# Patient Record
Sex: Female | Born: 1980 | Race: White | Hispanic: No | Marital: Single | Smoking: Current every day smoker
Health system: Southern US, Community
[De-identification: ages and names within clinical notes are randomized; demographics above are authoritative.]

## PROBLEM LIST (undated history)

## (undated) DIAGNOSIS — R011 Cardiac murmur, unspecified: Secondary | ICD-10-CM

## (undated) DIAGNOSIS — J40 Bronchitis, not specified as acute or chronic: Secondary | ICD-10-CM

## (undated) HISTORY — PX: TONSILLECTOMY: SUR1361

---

## 2004-08-03 ENCOUNTER — Emergency Department: Payer: Self-pay | Admitting: Emergency Medicine

## 2004-09-13 ENCOUNTER — Emergency Department: Payer: Self-pay | Admitting: General Practice

## 2004-10-24 ENCOUNTER — Emergency Department: Payer: Self-pay | Admitting: Emergency Medicine

## 2004-10-25 ENCOUNTER — Ambulatory Visit: Payer: Self-pay | Admitting: Emergency Medicine

## 2004-11-10 ENCOUNTER — Emergency Department: Payer: Self-pay | Admitting: Emergency Medicine

## 2004-11-23 ENCOUNTER — Emergency Department: Payer: Self-pay | Admitting: General Practice

## 2004-12-05 ENCOUNTER — Emergency Department: Payer: Self-pay | Admitting: Emergency Medicine

## 2006-01-24 ENCOUNTER — Emergency Department: Payer: Self-pay | Admitting: Emergency Medicine

## 2006-04-05 ENCOUNTER — Other Ambulatory Visit: Payer: Self-pay

## 2006-04-05 ENCOUNTER — Emergency Department: Payer: Self-pay | Admitting: Emergency Medicine

## 2006-11-24 ENCOUNTER — Emergency Department: Payer: Self-pay | Admitting: Emergency Medicine

## 2006-11-26 ENCOUNTER — Emergency Department: Payer: Self-pay | Admitting: Unknown Physician Specialty

## 2006-12-12 ENCOUNTER — Emergency Department: Payer: Self-pay | Admitting: Emergency Medicine

## 2007-02-27 ENCOUNTER — Emergency Department: Payer: Self-pay | Admitting: Unknown Physician Specialty

## 2007-04-12 ENCOUNTER — Observation Stay: Payer: Self-pay | Admitting: Unknown Physician Specialty

## 2007-07-02 ENCOUNTER — Observation Stay: Payer: Self-pay | Admitting: Obstetrics and Gynecology

## 2007-07-21 ENCOUNTER — Inpatient Hospital Stay: Payer: Self-pay

## 2007-08-21 ENCOUNTER — Emergency Department: Payer: Self-pay | Admitting: Emergency Medicine

## 2009-02-02 ENCOUNTER — Emergency Department: Payer: Self-pay | Admitting: Emergency Medicine

## 2010-08-14 ENCOUNTER — Emergency Department: Payer: Self-pay | Admitting: Emergency Medicine

## 2011-01-12 ENCOUNTER — Emergency Department: Payer: Self-pay | Admitting: Emergency Medicine

## 2012-10-15 ENCOUNTER — Emergency Department: Payer: Self-pay | Admitting: Emergency Medicine

## 2012-10-15 LAB — COMPREHENSIVE METABOLIC PANEL
Albumin: 3.6 g/dL (ref 3.4–5.0)
Alkaline Phosphatase: 98 U/L (ref 50–136)
Anion Gap: 8 (ref 7–16)
BUN: 6 mg/dL — ABNORMAL LOW (ref 7–18)
Calcium, Total: 9.1 mg/dL (ref 8.5–10.1)
Chloride: 108 mmol/L — ABNORMAL HIGH (ref 98–107)
Creatinine: 0.61 mg/dL (ref 0.60–1.30)
EGFR (African American): >60
EGFR (Non-African Amer.): >60
Osmolality: 278 (ref 275–301)
Potassium: 3.8 mmol/L (ref 3.5–5.1)
SGOT(AST): 16 U/L (ref 15–37)
SGPT (ALT): 27 U/L (ref 12–78)
Sodium: 140 mmol/L (ref 136–145)
Total Protein: 7.2 g/dL (ref 6.4–8.2)

## 2012-10-15 LAB — URINALYSIS, COMPLETE
Bilirubin,UR: NEGATIVE
Ketone: NEGATIVE
Specific Gravity: 1.024 (ref 1.003–1.030)

## 2012-10-15 LAB — CBC
HGB: 13.9 g/dL (ref 12.0–16.0)
MCHC: 32.5 g/dL (ref 32.0–36.0)
MCV: 93 fL (ref 80–100)
Platelet: 332 10*3/uL (ref 150–440)
RBC: 4.6 10*6/uL (ref 3.80–5.20)
WBC: 15.3 10*3/uL — ABNORMAL HIGH (ref 3.6–11.0)

## 2012-10-15 LAB — PREGNANCY, URINE: Pregnancy Test, Urine: POSITIVE m[IU]/mL

## 2012-10-25 ENCOUNTER — Emergency Department: Payer: Self-pay | Admitting: Emergency Medicine

## 2012-10-25 LAB — URINALYSIS, COMPLETE
Glucose,UR: NEGATIVE mg/dL (ref 0–75)
Ketone: NEGATIVE
Specific Gravity: 1.02 (ref 1.003–1.030)
Squamous Epithelial: 8

## 2012-10-25 LAB — CBC
HCT: 41 % (ref 35.0–47.0)
HGB: 14.4 g/dL (ref 12.0–16.0)
Platelet: 289 10*3/uL (ref 150–440)
RBC: 4.43 10*6/uL (ref 3.80–5.20)
WBC: 12.5 10*3/uL — ABNORMAL HIGH (ref 3.6–11.0)

## 2012-10-25 LAB — HCG, QUANTITATIVE, PREGNANCY: Beta Hcg, Quant.: 1226 m[IU]/mL — ABNORMAL HIGH

## 2013-01-15 ENCOUNTER — Ambulatory Visit: Payer: Self-pay | Admitting: Family Medicine

## 2013-01-15 LAB — RAPID STREP-A WITH REFLX: Micro Text Report: NEGATIVE

## 2013-01-18 LAB — BETA STREP CULTURE(ARMC)

## 2013-07-13 ENCOUNTER — Emergency Department: Payer: Self-pay | Admitting: Emergency Medicine

## 2013-08-08 ENCOUNTER — Emergency Department: Payer: Self-pay | Admitting: Emergency Medicine

## 2013-08-08 LAB — URINALYSIS, COMPLETE
BILIRUBIN, UR: NEGATIVE
Blood: NEGATIVE
GLUCOSE, UR: NEGATIVE mg/dL (ref 0–75)
KETONE: NEGATIVE
NITRITE: NEGATIVE
Ph: 6 (ref 4.5–8.0)
Protein: NEGATIVE
Specific Gravity: 1.014 (ref 1.003–1.030)
Squamous Epithelial: 14
WBC UR: 20 /HPF (ref 0–5)

## 2013-08-08 LAB — CBC
HCT: 38.8 % (ref 35.0–47.0)
HGB: 13.3 g/dL (ref 12.0–16.0)
MCH: 30.7 pg (ref 26.0–34.0)
MCHC: 34.3 g/dL (ref 32.0–36.0)
MCV: 90 fL (ref 80–100)
Platelet: 261 10*3/uL (ref 150–440)
RBC: 4.33 10*6/uL (ref 3.80–5.20)
RDW: 13.6 % (ref 11.5–14.5)
WBC: 14.6 10*3/uL — AB (ref 3.6–11.0)

## 2013-08-08 LAB — WET PREP, GENITAL

## 2013-08-08 LAB — HCG, QUANTITATIVE, PREGNANCY: Beta Hcg, Quant.: 89431 m[IU]/mL — ABNORMAL HIGH

## 2013-08-08 LAB — GC/CHLAMYDIA PROBE AMP

## 2013-08-15 ENCOUNTER — Ambulatory Visit: Payer: Self-pay | Admitting: Family Medicine

## 2013-08-15 DIAGNOSIS — I059 Rheumatic mitral valve disease, unspecified: Secondary | ICD-10-CM

## 2013-09-08 ENCOUNTER — Encounter: Payer: Self-pay | Admitting: Maternal and Fetal Medicine

## 2013-10-14 ENCOUNTER — Emergency Department: Payer: Self-pay | Admitting: Emergency Medicine

## 2013-10-14 LAB — URINALYSIS, COMPLETE
Bacteria: NONE SEEN
Bilirubin,UR: NEGATIVE
Glucose,UR: NEGATIVE mg/dL (ref 0–75)
KETONE: NEGATIVE
Leukocyte Esterase: NEGATIVE
NITRITE: NEGATIVE
PROTEIN: NEGATIVE
Ph: 7 (ref 4.5–8.0)
Specific Gravity: 1.003 (ref 1.003–1.030)
Squamous Epithelial: 3
WBC UR: 1 /HPF (ref 0–5)

## 2013-10-14 LAB — CBC WITH DIFFERENTIAL/PLATELET
BASOS ABS: 0.1 10*3/uL (ref 0.0–0.1)
BASOS PCT: 0.4 %
EOS PCT: 0.8 %
Eosinophil #: 0.1 10*3/uL (ref 0.0–0.7)
HCT: 34.8 % — ABNORMAL LOW (ref 35.0–47.0)
HGB: 12 g/dL (ref 12.0–16.0)
Lymphocyte #: 2.6 10*3/uL (ref 1.0–3.6)
Lymphocyte %: 15.9 %
MCH: 31.9 pg (ref 26.0–34.0)
MCHC: 34.3 g/dL (ref 32.0–36.0)
MCV: 93 fL (ref 80–100)
MONOS PCT: 5 %
Monocyte #: 0.8 x10 3/mm (ref 0.2–0.9)
NEUTROS ABS: 12.6 10*3/uL — AB (ref 1.4–6.5)
NEUTROS PCT: 77.9 %
PLATELETS: 264 10*3/uL (ref 150–440)
RBC: 3.75 10*6/uL — AB (ref 3.80–5.20)
RDW: 13.9 % (ref 11.5–14.5)
WBC: 16.3 10*3/uL — ABNORMAL HIGH (ref 3.6–11.0)

## 2013-10-14 LAB — COMPREHENSIVE METABOLIC PANEL
ALBUMIN: 2.8 g/dL — AB (ref 3.4–5.0)
ALT: 16 U/L (ref 12–78)
Alkaline Phosphatase: 127 U/L — ABNORMAL HIGH
Anion Gap: 5 — ABNORMAL LOW (ref 7–16)
BILIRUBIN TOTAL: 0.2 mg/dL (ref 0.2–1.0)
BUN: 5 mg/dL — AB (ref 7–18)
CO2: 27 mmol/L (ref 21–32)
CREATININE: 0.5 mg/dL — AB (ref 0.60–1.30)
Calcium, Total: 8.9 mg/dL (ref 8.5–10.1)
Chloride: 105 mmol/L (ref 98–107)
EGFR (Non-African Amer.): >60
Glucose: 94 mg/dL (ref 65–99)
Osmolality: 271 (ref 275–301)
POTASSIUM: 4.1 mmol/L (ref 3.5–5.1)
SGOT(AST): 12 U/L — ABNORMAL LOW (ref 15–37)
SODIUM: 137 mmol/L (ref 136–145)
TOTAL PROTEIN: 6.7 g/dL (ref 6.4–8.2)

## 2013-10-16 ENCOUNTER — Emergency Department: Payer: Self-pay | Admitting: Sports Medicine

## 2014-01-06 ENCOUNTER — Observation Stay: Payer: Self-pay | Admitting: Obstetrics and Gynecology

## 2014-01-06 LAB — URINALYSIS, COMPLETE
Bacteria: NONE SEEN
Bilirubin,UR: NEGATIVE
Blood: NEGATIVE
Glucose,UR: NEGATIVE mg/dL (ref 0–75)
Ketone: NEGATIVE
NITRITE: NEGATIVE
Ph: 6 (ref 4.5–8.0)
Protein: NEGATIVE
Specific Gravity: 1.014 (ref 1.003–1.030)
Squamous Epithelial: 39
WBC UR: 4 /HPF (ref 0–5)

## 2014-01-07 LAB — URINE CULTURE

## 2014-01-18 ENCOUNTER — Ambulatory Visit: Payer: Self-pay | Admitting: Family Medicine

## 2014-02-12 ENCOUNTER — Observation Stay: Payer: Self-pay | Admitting: Obstetrics and Gynecology

## 2014-02-12 ENCOUNTER — Observation Stay: Payer: Self-pay

## 2014-02-13 LAB — URINALYSIS, COMPLETE
BILIRUBIN, UR: NEGATIVE
Bacteria: NONE SEEN
Blood: NEGATIVE
Glucose,UR: NEGATIVE mg/dL (ref 0–75)
KETONE: NEGATIVE
Leukocyte Esterase: NEGATIVE
NITRITE: NEGATIVE
Ph: 6 (ref 4.5–8.0)
Protein: NEGATIVE
RBC,UR: 1 /HPF (ref 0–5)
Specific Gravity: 1.008 (ref 1.003–1.030)
Squamous Epithelial: 2
WBC UR: NONE SEEN /HPF (ref 0–5)

## 2014-02-27 ENCOUNTER — Observation Stay: Payer: Self-pay | Admitting: Obstetrics and Gynecology

## 2014-03-04 ENCOUNTER — Inpatient Hospital Stay: Payer: Self-pay | Admitting: Obstetrics and Gynecology

## 2014-03-04 LAB — CBC WITH DIFFERENTIAL/PLATELET
Basophil #: 0.1 10*3/uL (ref 0.0–0.1)
Basophil %: 0.4 %
EOS ABS: 0.2 10*3/uL (ref 0.0–0.7)
Eosinophil %: 0.7 %
HCT: 36.2 % (ref 35.0–47.0)
HGB: 12.2 g/dL (ref 12.0–16.0)
LYMPHS PCT: 15.8 %
Lymphocyte #: 3.5 10*3/uL (ref 1.0–3.6)
MCH: 30.7 pg (ref 26.0–34.0)
MCHC: 33.7 g/dL (ref 32.0–36.0)
MCV: 91 fL (ref 80–100)
Monocyte #: 1.2 x10 3/mm — ABNORMAL HIGH (ref 0.2–0.9)
Monocyte %: 5.3 %
NEUTROS PCT: 77.8 %
Neutrophil #: 17 10*3/uL — ABNORMAL HIGH (ref 1.4–6.5)
PLATELETS: 334 10*3/uL (ref 150–440)
RBC: 3.96 10*6/uL (ref 3.80–5.20)
RDW: 14.4 % (ref 11.5–14.5)
WBC: 21.9 10*3/uL — AB (ref 3.6–11.0)

## 2014-03-04 LAB — DRUG SCREEN, URINE

## 2014-03-05 LAB — HEMATOCRIT: HCT: 31.3 % — ABNORMAL LOW (ref 35.0–47.0)

## 2014-05-21 IMAGING — US US OB < 14 WEEKS - US OB TV
1 series · 14 of 28 positions shown · non-contrast
Comparison: none

REASON FOR EXAM: vaginal bleeding recent positive pregnancy test eval
COMMENTS:

PROCEDURE:     US  - US OB LESS THAN 14 WEEKS/W TRANS  - October 16, 2012  [DATE]
RESULT:     Comparison: 01/13/2011
TECHNIQUE: Multiple grayscale and color Doppler images were obtained of the
pelvis via transabdominal and endovaginal ultrasound.

[Series 1: us ob < 14 weeks - us ob tv · 0.28mm/px · 14 of 45 slices shown]
[im 2/45]
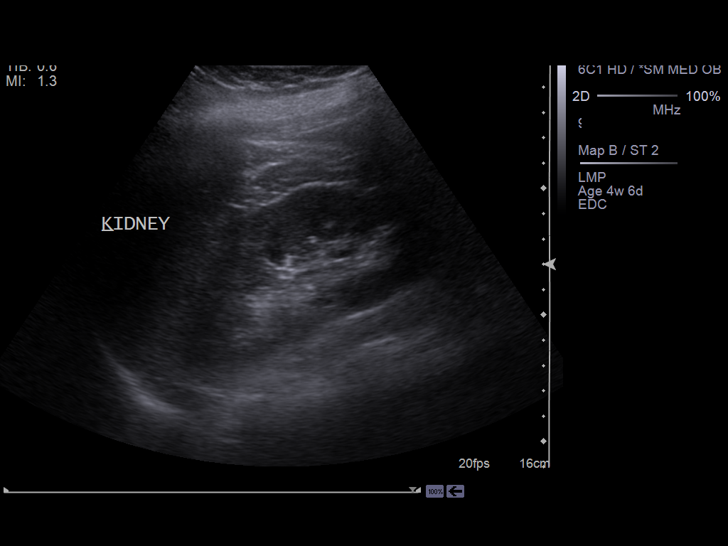
[im 5/45]
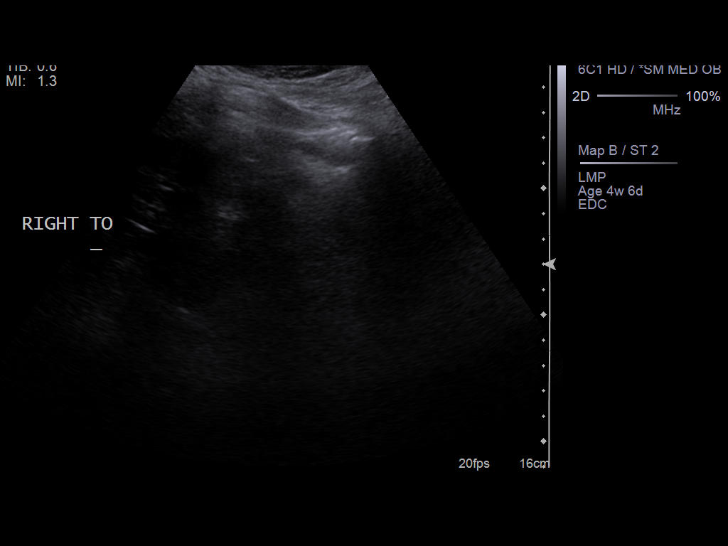
[im 9/45]
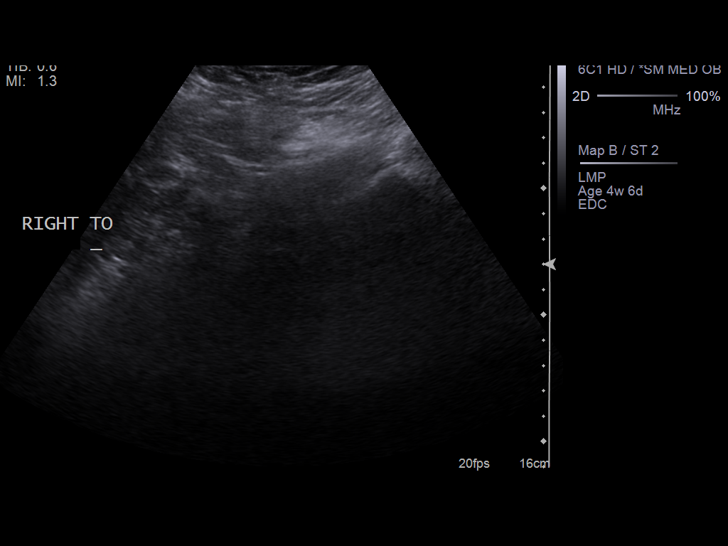
[im 12/45]
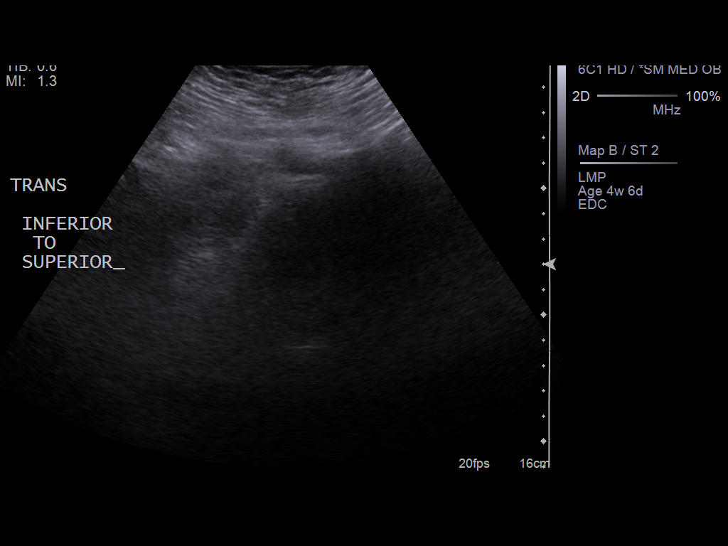
[im 15/45]
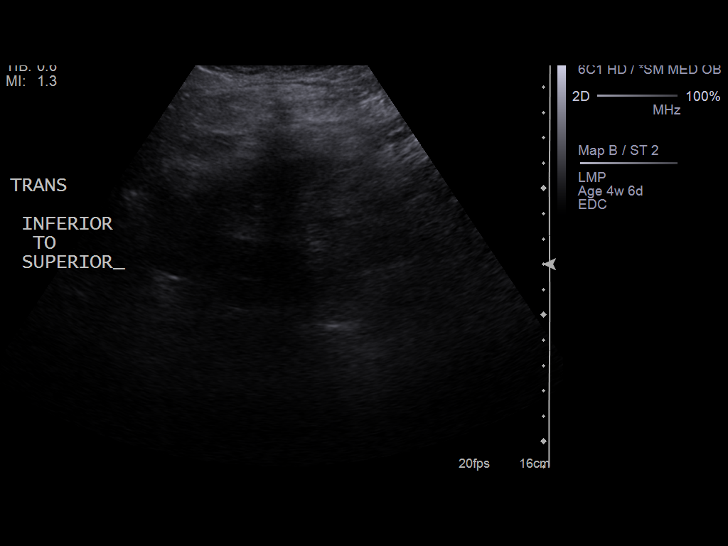
[im 18/45]
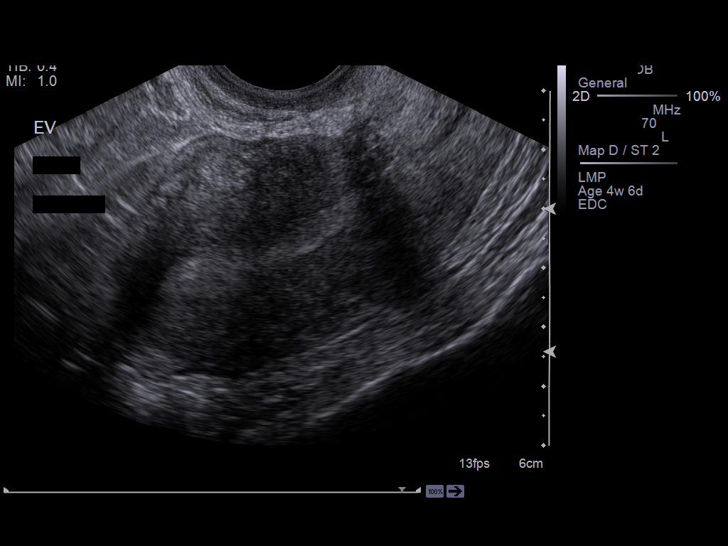
[im 22/45]
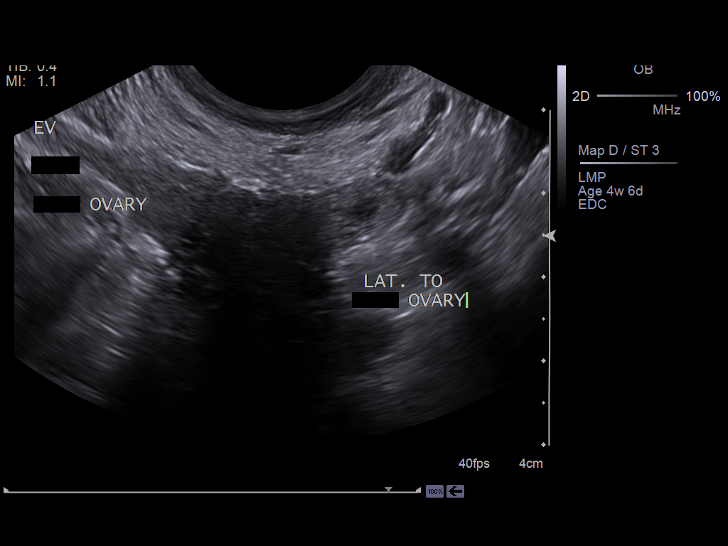
[im 25/45]
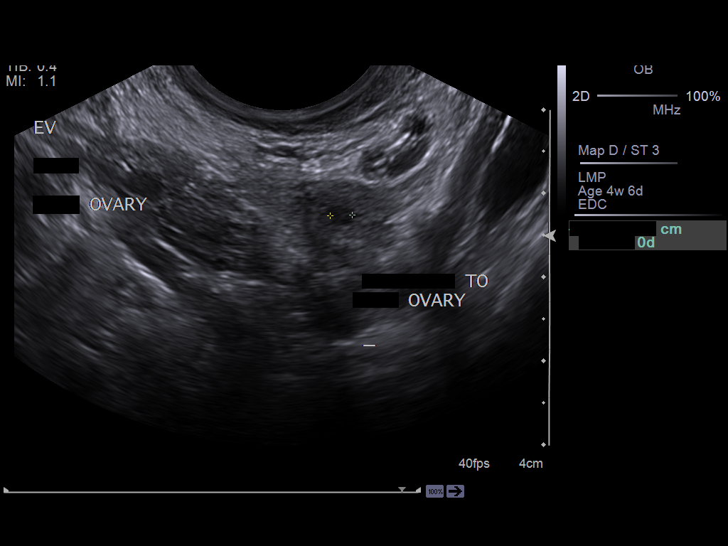
[im 28/45]
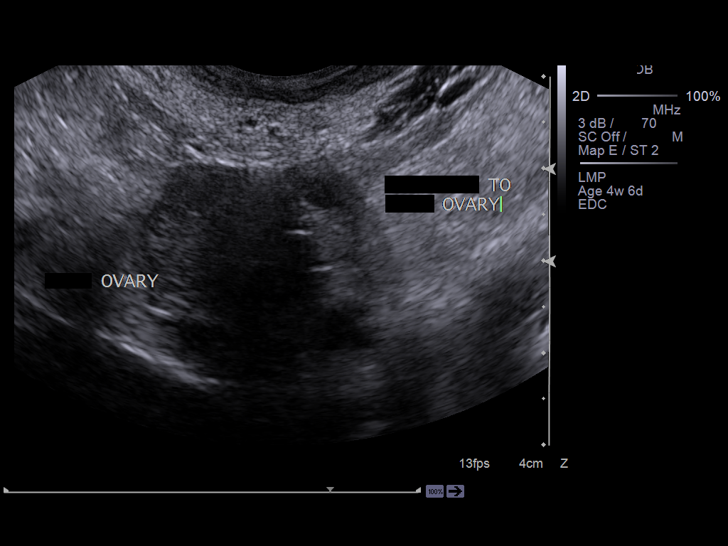
[im 31/45]
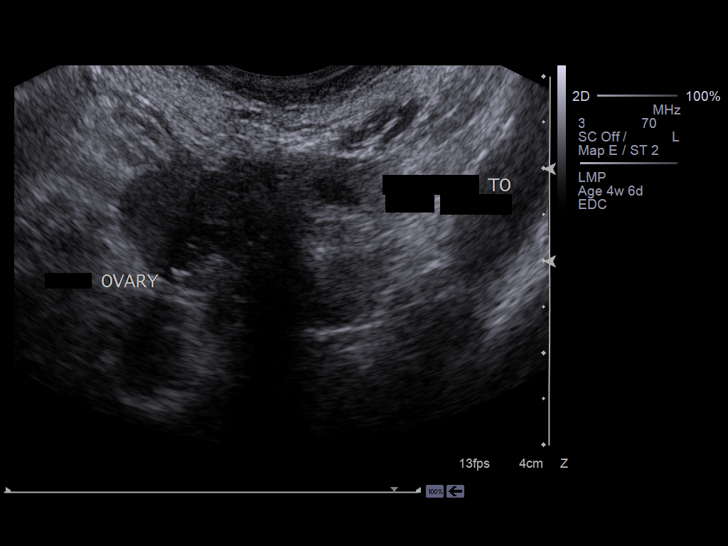
[im 35/45]
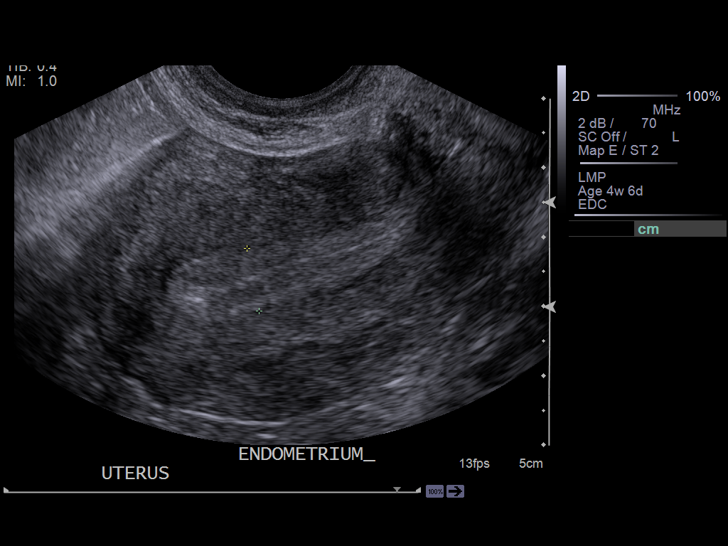
[im 38/45]
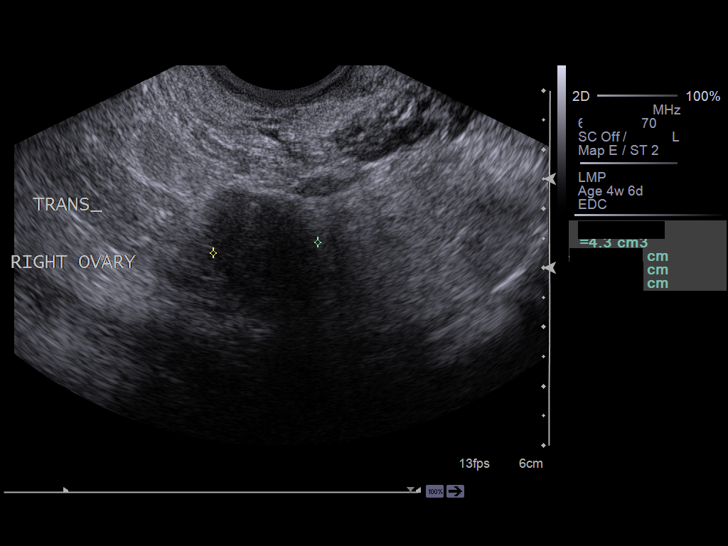
[im 41/45]
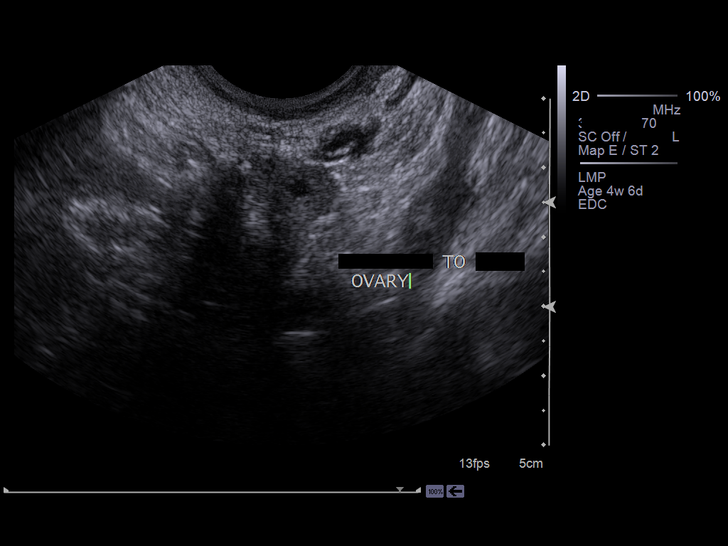
[im 45/45]
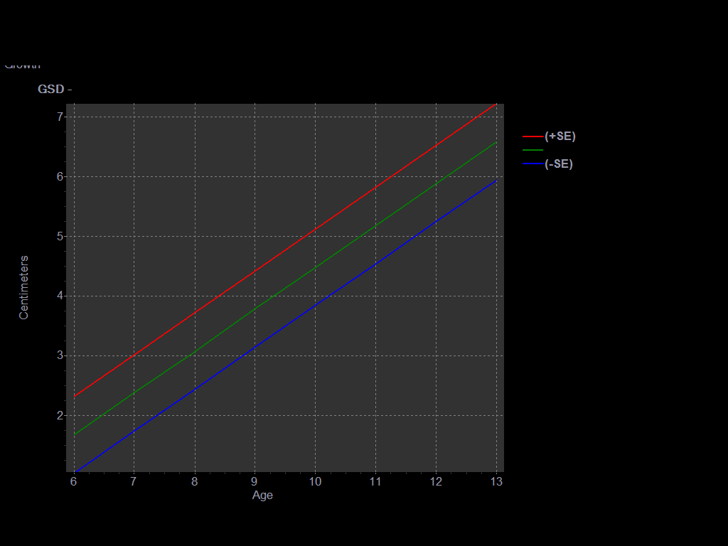

[14 of 28 positions shown; findings below may reference images not displayed]

FINDINGS: The endometrial stripe measures 9 mm in thickness. No gestational sac seen
within the endometrial canal. There is a complex round hypoechoic structure
in the left adnexa adjacent to the left ovary. It measures 1.2 x 1.0 cm.
There is a central area of hypoechogenicity raising the possibility of a
gestational sac and ectopic pregnancy.

The right ovary measures 2.5 x 1.8 x 1.8 cm. The left ovary measures 2.9 x
2.2 x 1.8 cm. Doppler imaging was not performed of the ovaries.
IMPRESSION: No gestational sac identified within the endometrial canal. There is a
complex structure in the left adnexa which is indeterminate, but raises the
possibility of ectopic pregnancy. Clinical correlation is recommended.
Followup beta hCGs are recommended. Followup ultrasound is also suggested.

## 2014-11-28 NOTE — Op Note (Signed)
PATIENT NAME:  [name], Anne W MR#:  [id number] DATE OF BIRTH:  [birth year]  DATE OF PROCEDURE:  03/04/2014  PREOPERATIVE DIAGNOSIS:  Term intrauterine pregnancy with labor and prior history of cesarean section.   POSTOPERATIVE DIAGNOSIS:  Term intrauterine pregnancy with labor and prior history of cesarean section.   PROCEDURES PERFORMED: Low transverse cesarean section. Placement of On-Q pain pump.   SURGEON: Annamarie MajorPaul Chandell Attridge, M.D.   ASSISTANT: Vena AustriaAndreas Staebler, M.D.   ANESTHESIA: Spinal.   ESTIMATED BLOOD LOSS: 250 mL.   COMPLICATIONS: None.   FINDINGS: Normal tubes, ovaries, and uterus. Viable female infant weighing 7 pounds, 7 ounces with Apgar scores of 8 and 9 at 1 and 5 minutes, respectively.   DISPOSITION: To the recovery room in stable condition.   TECHNIQUE: The patient was prepped and draped in the usual sterile fashion after adequate anesthesia was obtained in the supine position on the operating table. Scalpel was used to create a low transverse skin incision through the area of the prior scar down to the level of the rectus fascia which was then dissected bilaterally using Mayo scissors. Rectus muscle separated in the midline, peritoneum was penetrated and the bladder was inferiorly dissected and retracted. There were no injuries or problems in this area. A scalpel was used to create a low transverse hysterotomy incision that was extended by blunt dissection. The amniotomy revealed clear fluid. The infant's head was grasped and delivered without the use of a suction device and no nuchal cord was encountered.   Oropharynx was suctioned and the remaining portion of the infant was delivered cord clamped and cut and the infant handed to the pediatric team.   Cord blood was obtained, the placenta was manually extracted. The uterus was externalized and cleansed of all membranes and debris using a moist sponge. The hysterotomy incision was closed with a running #1 Vicryl suture in a  locking fashion with excellent hemostasis. The uterus was placed back in the intraabdominal cavity and the paracolic gutters were irrigated with warm saline.   Re-examination of the incision revealed excellent hemostasis. The peritoneum was closed with a Vicryl suture. The trocars were placed through the abdomen into the subfascial space and these were used to thread the Silver Soaker catheters of the On-Q pain pump into place.  The fascia was closed with 0-Maxon suture with care taken not to incorporate these catheters.  The catheters were each flushed with 5 mL of bupivacaine and stabilized into place with Steri-Strips and a Tegaderm bandage.  Subcutaneous tissues were irrigated and hemostasis is assured using electrocautery. Skin was  closed with 4-0 Vicryl suture in a subcuticular fashion followed by placement of skin glue. The patient went to the recovery room in stable condition. All sponge, instrument, and needle counts are correct.    ____________________________ R. Annamarie MajorPaul Matas Burrows, MD rph:lt D: 03/04/2014 08:36:18 ET T: 03/04/2014 10:42:57 ET JOB#: [id number]  cc: Dierdre Searles. Paul Jalise Zawistowski, MD, <Dictator> Nadara MustardOBERT P Thomasena Vandenheuvel MD ELECTRONICALLY SIGNED 03/06/2014 10:04

## 2014-12-15 NOTE — H&P (Signed)
L&D Evaluation:  History:  HPI 34 year old G13 P13093 with EDC= 03/16/14 by LMP=06/09/2013 presents at 35 3/7 weeks with reports of a gush of fluid after intercourse last night. She reports that she started having contractions after intercourse.  No VB.  NO N/V/D. Hx significant for CS x 3, smoking, threatened PTL at 30 weeks with Terbutaline x1, and obesity. PNC at [facility]. She is A+, RI, VI.   Presents with contractions, leaking fluid   Patient's Medical History depression   Patient's Surgical History Prior CS x 3, tonsilectomy   Medications Pre Natal Vitamins  Tylenol (Acetaminophen)   Allergies NKDA   Social History tobacco   Family History Non-Contributory   ROS:  ROS see HPI   Exam:  General no apparent distress   Mental Status clear   Abdomen gravid, tender with contractions, mild contractions   Edema trace   Pelvic cervix closed and thick, negative ferning, negative nitrazine.   Mebranes Intact   FHT normal rate with no decels, 130's +accels   Ucx q3-4 min   Skin dry, no lesions, no rashes   Lymph no lymphadenopathy   Impression:  Impression IUP at 35 3/7 weeks with contractions. Intact membranes   Plan:  Plan EFM/NST, monitor contractions and for cervical change, pt desires to be discharged. Labor precautions and FKC discussed   Follow Up Appointment already scheduled. today at [facility]   Electronic Signatures: Jannet MantisSubudhi, Peta Peachey (CNM)  (Signed 09-Jul-15 06:42)  Authored: L&D Evaluation   Last Updated: 09-Jul-15 06:42 by Jannet MantisSubudhi, Natoria Archibald (CNM)

## 2014-12-15 NOTE — H&P (Signed)
L&D Evaluation:  History:  HPI 34 year old G13 P13093 with EDC= 03/16/14 by LMP=06/09/2013 presents at 38 2/7 weeks with reports of a gush of fluid on presentation grossly ruptured. +FM, no VB, irregular ctx.  Followed at [facility] this pregnancy. Hx significant for CS x 3, smoking, threatened PTL at 30 weeks with Terbutaline x1, and obesity. PNC at [facility]. She is A+, RI, VI.   Presents with leaking fluid   Patient's Medical History depression   Patient's Surgical History Prior CS x 3, tonsilectomy   Medications Pre Natal Vitamins  Tylenol (Acetaminophen)   Allergies NKDA   Social History tobacco   Family History Non-Contributory   ROS:  ROS see HPI   Exam:  Vital Signs stable  111/76   Urine Protein not completed   General no apparent distress   Mental Status clear   Chest no increased work of breathing   Abdomen gravid, tender with contractions   Estimated Fetal Weight Average for gestational age   Fetal Position vtx   Edema trace   Pelvic nursing staff unable to reach but grossly ruptured   Mebranes Intact   FHT normal rate with no decels, 140, moderate, no accels, no decels   Ucx irregular   Skin dry, no lesions, no rashes   Lymph no lymphadenopathy   Impression:  Impression IUP at 38 2/7 weeks with SROM with 3 prior C-sections   Plan:  Plan EFM/NST   Comments 1) SROM Proceed with repeat LTCS secondary to SROM at term in setting of 3 prior C-sections, no tubal  2) Fetus - category II tracing  3) TDAP received this pregnancy  4) Disposition - pending delivery   LTCS Consent: I have had an exceedingly long and careful discussion with this patient about her circumstances and options available. She understands that I have carefully evaluated the infant's fetal heart rate pattern, the probable time remaining in her labor and her reserve. We are at a point where one of them will need to take a potential risk, either she take the risk of a cesarean  section or the infant take a risk that the fetal intolerance to labor is very significant with the potential for a real effect on the baby which will worsen if we allow labor to continue.  She also understands that interpretation of the FHT is not a precise science and that someone else might allow labor to continue for the present. Mutual decision made to proceed with abdominal surgery.  Fully informed consent obtained including the risks of anesthesia, hemorrhage, infection, injury to adjacent structures, bowel, bladder, and blood vessels..  Electronic Signatures: Lorrene ReidStaebler, Nyan Dufresne M (MD)  (Signed 29-Jul-15 06:43)  Authored: L&D Evaluation, Consent   Last Updated: 29-Jul-15 06:43 by Lorrene ReidStaebler, Laprecious Austill M (MD)

## 2014-12-15 NOTE — H&P (Signed)
L&D Evaluation:  History Expanded:  HPI 34 year old G13 P3093 with EDC= 03/16/14 by LMP=06/09/2013 presents at 37 4/7 weeks with reports of a gush of fluid tonight. she states she is having ctx also No VB.  NO N/V/D. Hx significant for CS x 3, smoking, threatened PTL at 30 weeks with Terbutaline x1, and obesity. PNC at [facility]. She is A+, RI, VI.   Gravida 13   Term 3   PreTerm 0   Abortion 9   Living 3   Blood Type (Maternal) A positive   Group B Strep Results Maternal (Result >5wks must be treated as unknown) negative   Maternal HIV Negative   Maternal Syphilis Ab Nonreactive   Maternal Varicella Immune   Rubella Results (Maternal) immune   Maternal T-Dap Unknown   [facility] 16-Mar-2014   Presents with leaking fluid   Patient's Medical History depression   Patient's Surgical History Previous C-Section  Prior CS x 3, tonsilectomy   Medications Pre Natal Vitamins  Tylenol (Acetaminophen)   Allergies NKDA   Social History tobacco  2 ppd   Family History Non-Contributory   ROS:  ROS see HPI   Exam:  Vital Signs stable   General no apparent distress   Mental Status clear   Chest clear   Abdomen gravid, non-tender   Estimated Fetal Weight Average for gestational age   Back no CVAT   Edema trace   Reflexes 1+   Pelvic cervix closed and thick, negative ferning, negative nitrazine. pt states pad wet, large yellow nitrazine negative pad wetness tested.   Mebranes Intact   FHT normal rate with no decels, 130's +accels   Ucx absent   Skin dry, no lesions, no rashes   Lymph no lymphadenopathy   Impression:  Impression IUP at 37 4/7 weeks, Intact membranes   Plan:  Plan EFM/NST, monitor contractions and for cervical change, labor precautions and FKC discussed   Comments Pt irritated that she is being sent home.  she states she wont be coming up here every two weeks. explained that she is correct that she will be going to the office weekly and then  cpme back top the hospital in 10 days for her csection. She says she doe not want to go home but she will need to since she in not rom or in labor   Follow Up Appointment already scheduled. today at [facility]   Electronic Signatures: Adria DevonKlett, Banjamin Stovall (MD)  (Signed 24-Jul-15 03:38)  Authored: L&D Evaluation   Last Updated: 24-Jul-15 03:38 by Adria DevonKlett, Aricela Bertagnolli (MD)

## 2014-12-15 NOTE — H&P (Signed)
L&D Evaluation:  History:  HPI 34 year old G13 P3093 with EDC= 03/16/14 by LMP=06/09/2013 presents at 30 1/7 weeks with c/o intermittent pelvic pressure with contractions since Sunday (2 days ago), that have worsened this AM after arising. No VB or LOF. Has had some dysuria x 1 day. NO N/V/D. Hx significant for CS x 3, smoking, and odesity. PNC at [facility].   Presents with contractions, dysuria   Patient's Medical History depression   Patient's Surgical History Prior CS x 3, tonsilectomy   Medications Pre Natal Vitamins  Tylenol (Acetaminophen)   Allergies NKDA   Social History tobacco   Family History Non-Contributory   ROS:  ROS see HPI   Exam:  General no apparent distress   Mental Status clear   Abdomen soft, gravid, tender in round ligament area bilaterally   Edema trace   Pelvic wet prep on white discharge negative. CX L/T/C/OOP   Mebranes Intact   FHT normal rate with no decels   Ucx q2 min   Skin dry   Impression:  Impression IUP at 30 1/7 weeks with threatened PTL. R/O UTI   Plan:  Plan EFM/NST, monitor contractions and for cervical change, UA pending. po hydration. Terbutaline 0.25 mgm subcut x 1   Electronic Signatures for Addendum Section:  Lorrene ReidStaebler, Andreas M (MD) (Signed Addendum 02-Jun-15 16:28)  no further contractions since receiving terbutaline.  Cervix rechecked remains closed, thick, and high   Electronic Signatures: Trinna BalloonGutierrez, Cassie Henkels L (CNM)  (Signed 02-Jun-15 13:47)  Authored: L&D Evaluation   Last Updated: 02-Jun-15 16:28 by Lorrene ReidStaebler, Andreas M (MD)

## 2015-01-04 ENCOUNTER — Emergency Department
Admission: EM | Admit: 2015-01-04 | Discharge: 2015-01-04 | Disposition: A | Discharge status: Home / Self Care | Payer: Medicaid Other | Attending: Student | Admitting: Student

## 2015-01-04 ENCOUNTER — Encounter: Payer: Self-pay | Admitting: Emergency Medicine

## 2015-01-04 DIAGNOSIS — J069 Acute upper respiratory infection, unspecified: Secondary | ICD-10-CM | POA: Insufficient documentation

## 2015-01-04 DIAGNOSIS — J209 Acute bronchitis, unspecified: Secondary | ICD-10-CM | POA: Insufficient documentation

## 2015-01-04 DIAGNOSIS — H9201 Otalgia, right ear: Secondary | ICD-10-CM | POA: Diagnosis present

## 2015-01-04 DIAGNOSIS — J0100 Acute maxillary sinusitis, unspecified: Secondary | ICD-10-CM

## 2015-01-04 DIAGNOSIS — Z720 Tobacco use: Secondary | ICD-10-CM | POA: Insufficient documentation

## 2015-01-04 HISTORY — DX: Cardiac murmur, unspecified: R01.1

## 2015-01-04 HISTORY — DX: Bronchitis, not specified as acute or chronic: J40

## 2015-01-04 MED ORDER — AMOXICILLIN 500 MG PO CAPS: 500.0000 mg | ORAL_CAPSULE | Freq: Three times a day (TID) | ORAL | Status: AC

## 2015-01-04 NOTE — ED Provider Notes (Signed)
[facility] Emergency Department Provider Note  ____________________________________________  Time seen: 1827  I have reviewed the triage vital signs and the nursing notes.   HISTORY  Chief Complaint Otalgia   HPI Anne Rose is a 34 y.o. female is here today with complaint of right ear pain. She denies any fever. She has had  congestion for approximately one year. She is unable to take any decongestant secondary to her breast-feeding has been using saline nasal spray with some relief of the congestion. She describes her pain as a 1 out of 10.  Past Medical History  Diagnosis Date  . Heart murmur   . Bronchitis     There are no active problems to display for this patient.   Past Surgical History  Procedure Laterality Date  . Cesarean section      x4  . Tonsillectomy      Current Outpatient Rx  Name  Route  Sig  Dispense  Refill  . amoxicillin (AMOXIL) 500 MG capsule   Oral   Take 1 capsule (500 mg total) by mouth 3 (three) times daily.   30 capsule   0     Allergies Amitriptyline  No family history on file.  Social History History  Substance Use Topics  . Smoking status: Current Every Day Smoker -- 0.50 packs/day    Types: Cigarettes  . Smokeless tobacco: Not on file  . Alcohol Use: No    Review of Systems Constitutional: No fever/chills Eyes: No visual changes.     Positive right ear pain ENT: No sore throat. Cardiovascular: Denies chest pain. Respiratory: Denies shortness of breath. Gastrointestinal: No abdominal pain.  No nausea, no vomiting.  No diarrhea.  No constipation. Genitourinary: Negative for dysuria. Musculoskeletal: Negative for back pain. Skin: Negative for rash. Neurological: Negative for headaches 10-point ROS otherwise negative.  ____________________________________________   PHYSICAL EXAM:  VITAL SIGNS: ED Triage Vitals  Enc Vitals Group     BP 01/04/15 1747 110/82 mmHg     Pulse Rate 01/04/15  1747 80     Resp 01/04/15 1747 18     Temp 01/04/15 1747 97.7 F (36.5 C)     Temp Source 01/04/15 1747 Oral     SpO2 01/04/15 1747 99 %     Weight 01/04/15 1747 208 lb (94.348 kg)     Height 01/04/15 1747  (1.626 m)     Head Cir --      Peak Flow --      Pain Score 01/04/15 1748 1     Pain Loc --      Pain Edu? --      Excl. in GC? --     Constitutional: Alert and oriented. Well appearing and in no acute distress. Eyes: Conjunctivae are normal. PERRL. EOMI. Head: Atraumatic. Tenderness on percussion of the right maxillary sinus. Nose: No congestion/rhinnorhea. Mouth/Throat: Mucous membranes are moist.  Oropharynx non-erythematous. Neck: No stridor.  Supple Hematological/Lymphatic/Immunilogical: No cervical lymphadenopathy. Cardiovascular: Normal rate, regular rhythm. Grossly normal heart sounds.  Good peripheral circulation. Respiratory: Normal respiratory effort.  No retractions. Lungs CTAB. Gastrointestinal: Soft and nontender. No distention. No abdominal bruits. No CVA tenderness. Musculoskeletal: No lower extremity tenderness nor edema.  No joint effusions. Neurologic:  Normal speech and language. No gross focal neurologic deficits are appreciated. Speech is normal. No gait instability. Skin:  Skin is warm, dry and intact. No rash noted. Psychiatric: Mood and affect are normal. Speech and behavior are normal.  ____________________________________________  LABS (all labs ordered are listed, but only abnormal results are displayed)  Labs Reviewed - No data to display ____________________________________________  PROCEDURES  Procedure(s) performed: None  Critical Care performed: No  ____________________________________________   INITIAL IMPRESSION / ASSESSMENT AND PLAN / ED COURSE  Pertinent labs & imaging results that were available during my care of the patient were reviewed by me and considered in my medical decision making (see chart for  details).  Patient is follow-up with her doctor if any continued problems. She will continue using saline nose spray as needed for congestion ____________________________________________   FINAL CLINICAL IMPRESSION(S) / ED DIAGNOSES  Final diagnoses:  Acute upper respiratory infection  Acute maxillary sinusitis, recurrence not specified      Tommi Rumpshonda L Summers, PA-C 01/04/15 1948  Gayla DossEryka A Gayle, MD 01/04/15 2150

## 2015-01-04 NOTE — ED Notes (Signed)
Denies drainage 

## 2015-01-04 NOTE — Discharge Instructions (Signed)
CONTINUE SALINE NOSE SPRAY AS TOLERATED AMOXIL FOR 10 DAYS FOLLOW UP WITH YOUR DOCTOR IF ANY CONTINUED PROBLEMS

## 2015-03-13 IMAGING — US US OB < 14 WEEKS
1 series · 14 of 28 positions shown · non-contrast
Comparison: 10/16/2012

CLINICAL DATA: Pelvic pain.

EXAM:
OBSTETRIC <14 WK ULTRASOUND
TECHNIQUE: Transabdominal ultrasound was performed for evaluation of the
gestation as well as the maternal uterus and adnexal regions.

[Series 1: us ob < 14 weeks · 0.23mm/px · 14 of 30 slices shown]
[im 2/30]
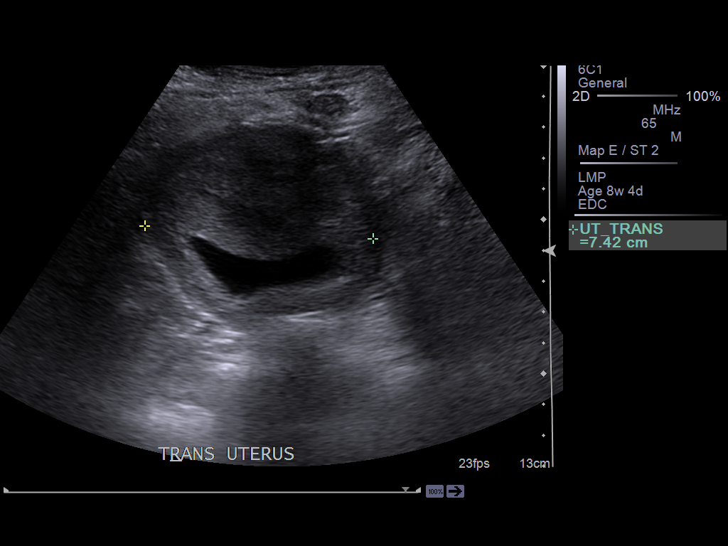
[im 4/30]
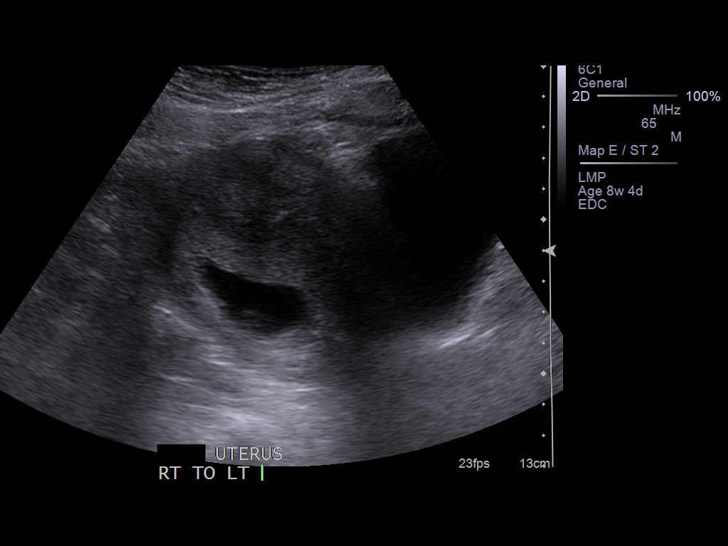
[im 6/30]
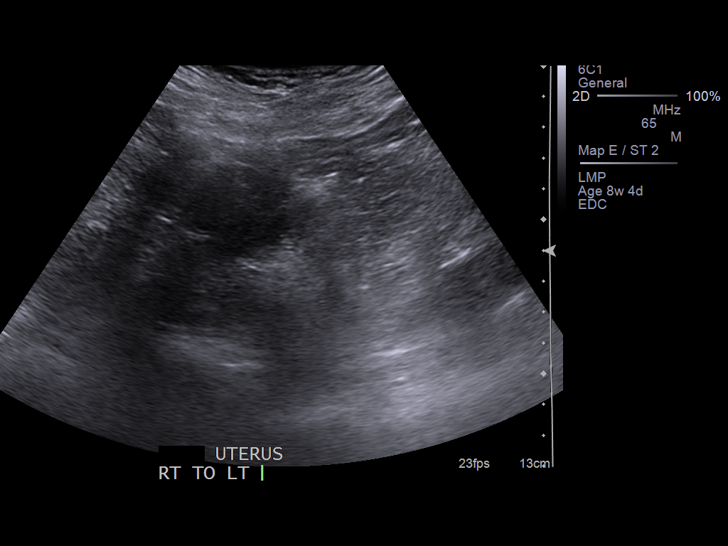
[im 8/30]
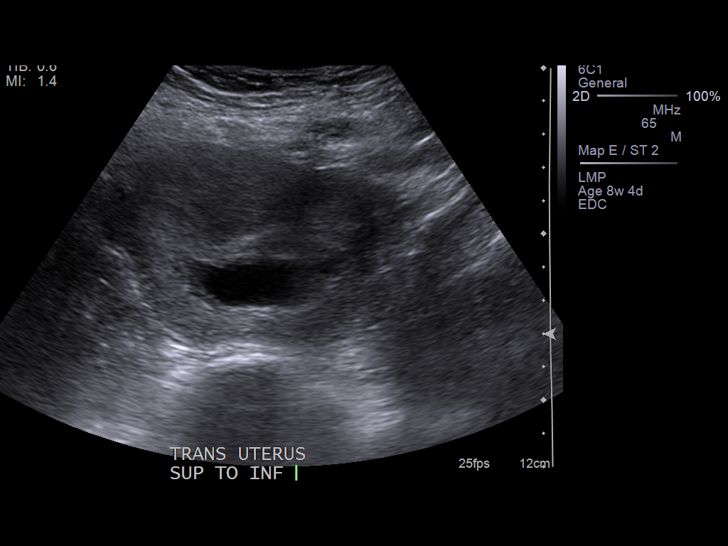
[im 10/30]
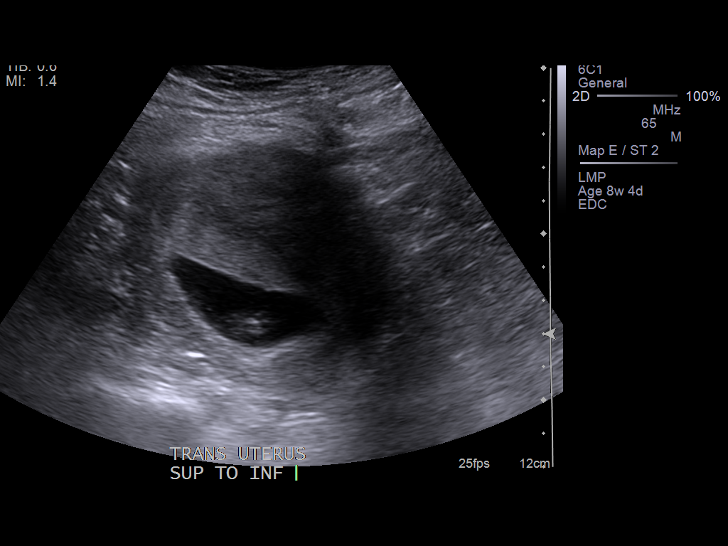
[im 12/30]
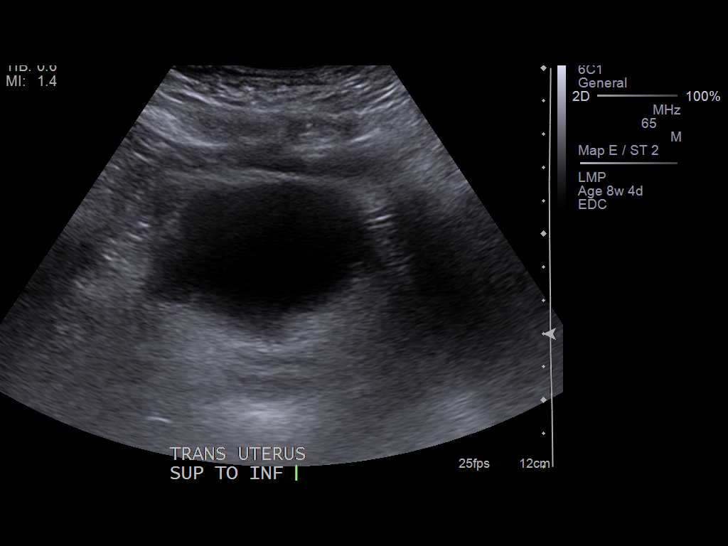
[im 14/30]
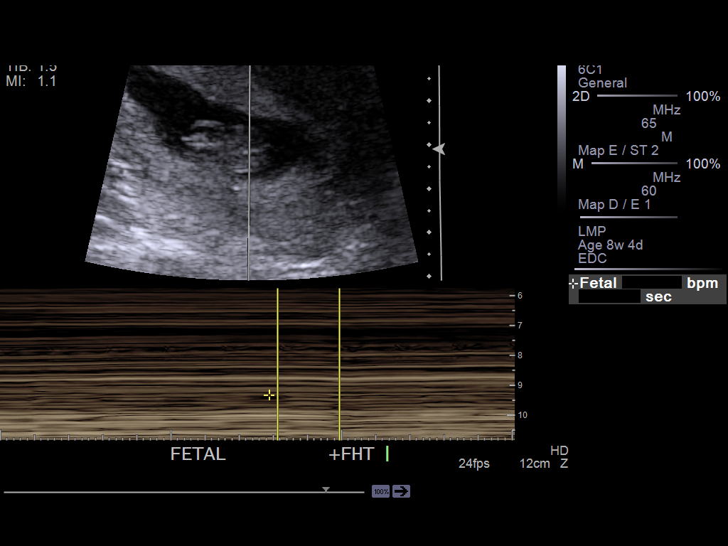
[im 17/30]
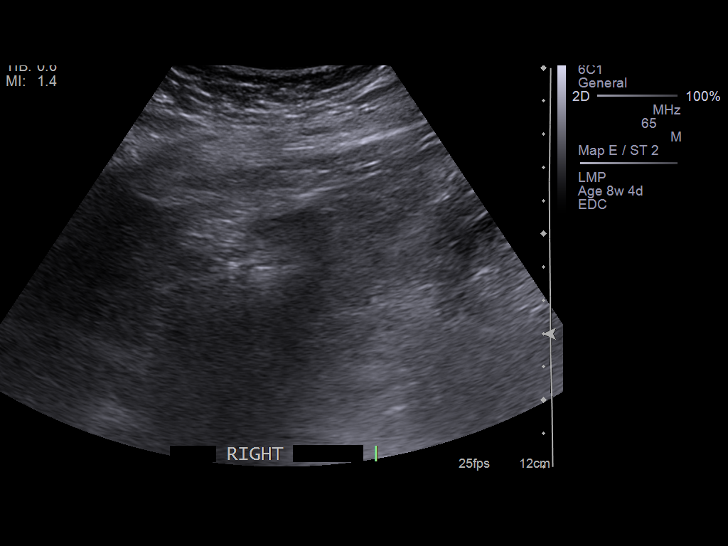
[im 19/30]
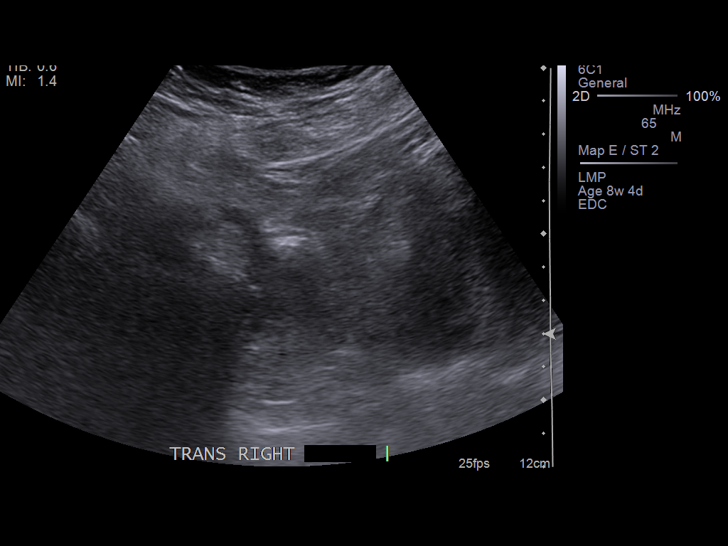
[im 21/30]
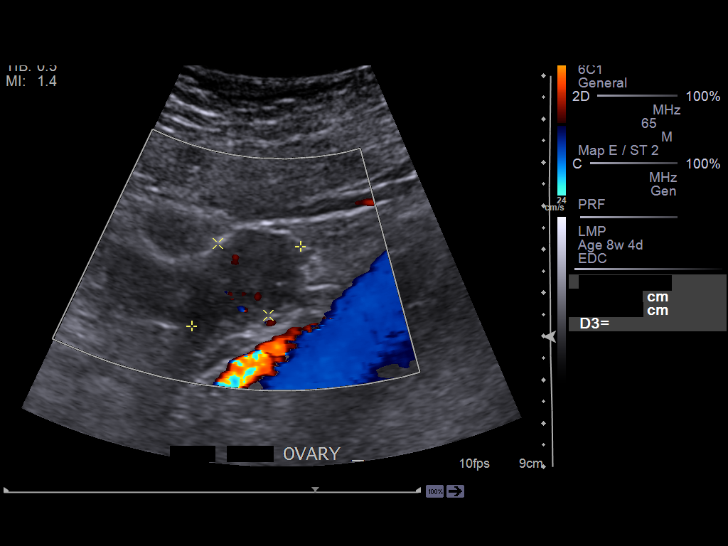
[im 23/30]
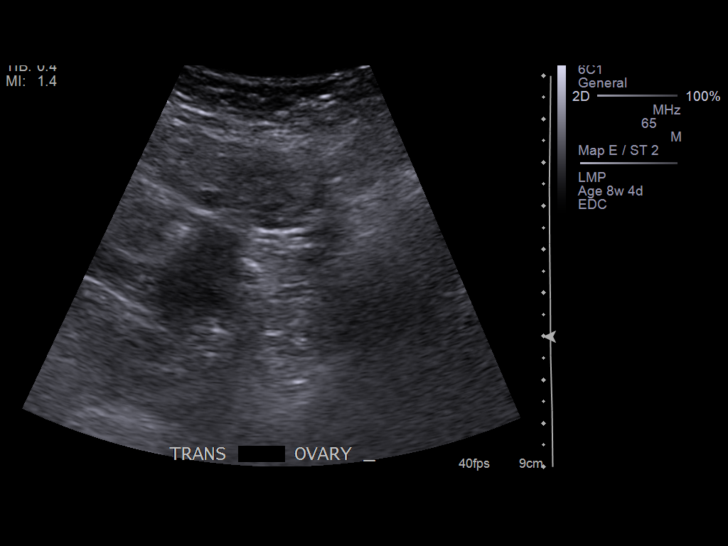
[im 25/30]
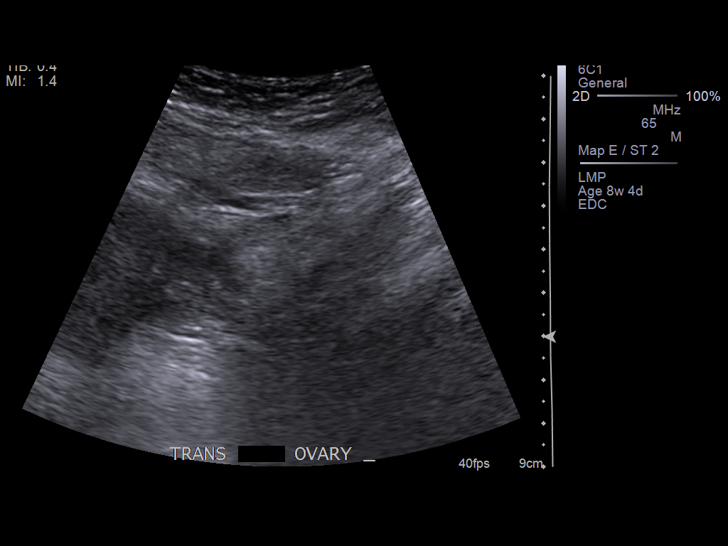
[im 27/30]
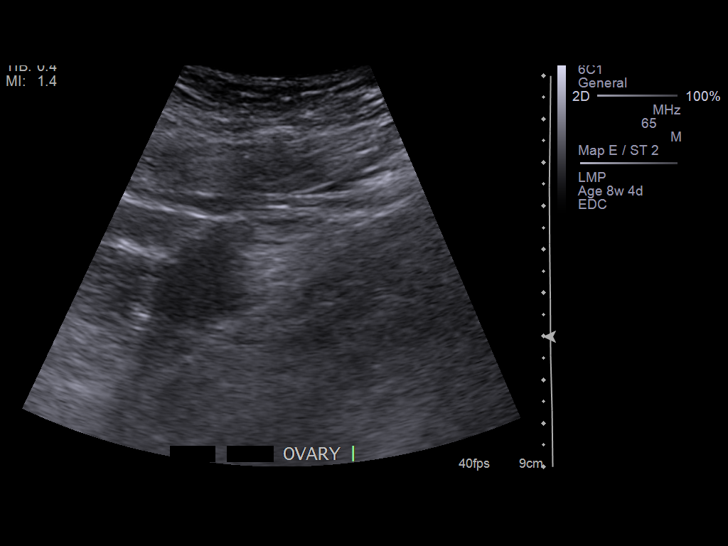
[im 30/30]
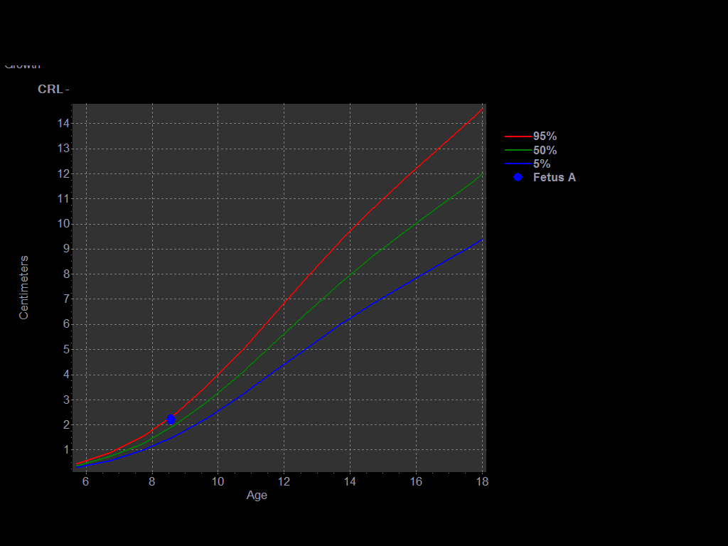

[14 of 28 positions shown; findings below may reference images not displayed]

FINDINGS: Intrauterine gestational sac: Visualized/normal in shape.

Yolk sac:  None.

Embryo:  Present.

Cardiac Activity: Present.

Heart Rate: 165. Bpm

MSD:   mm    w     d

CRL:   22  mm   8 w 6 d                  US EDC: 03/14/2014

Maternal uterus/adnexae:

No subchorionic hemorrhage.

Normal left ovary.

Non visualize right ovary.

No free pelvic fluid collections.
IMPRESSION: Single living intrauterine fetus estimated at 8 weeks and 6 days
gestation.

No subchorionic hemorrhage.

## 2015-04-13 IMAGING — US US OB NUCHAL TRANSLUCENCY 1ST GEST
1 series · 14 of 28 positions shown · non-contrast
Comparison: none

[Series 1: us ob nuchal translucency 1st gest · 0.25mm/px · 14 of 70 slices shown]
[im 3/70]
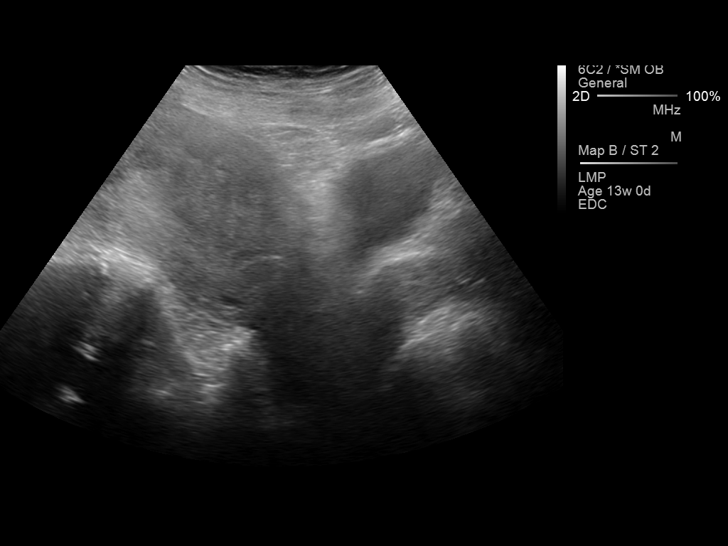
[im 8/70]
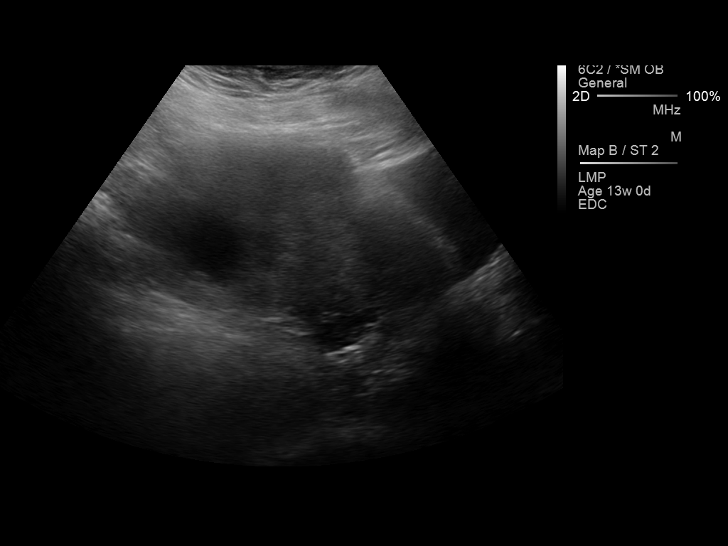
[im 13/70]
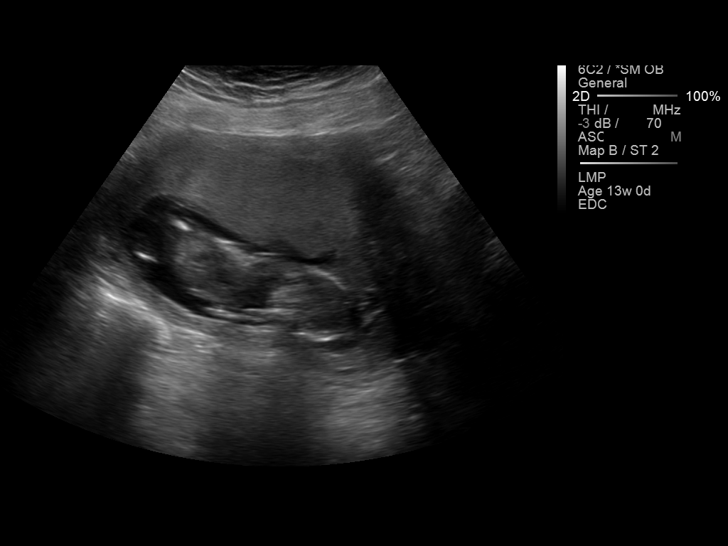
[im 18/70]
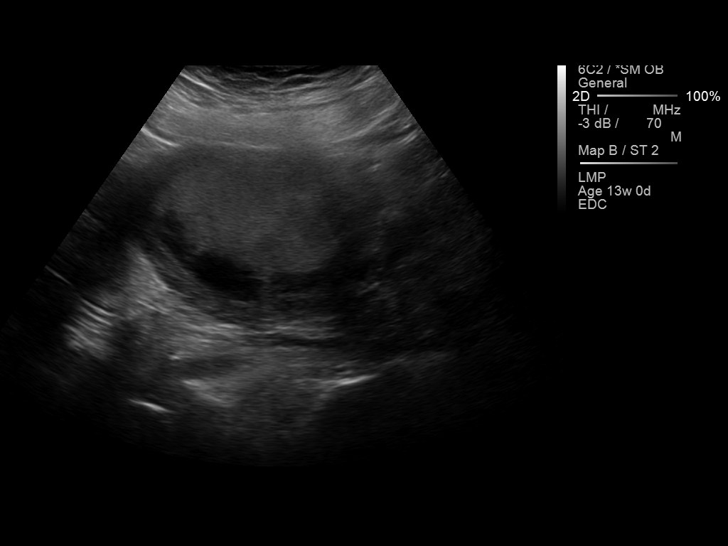
[im 24/70]
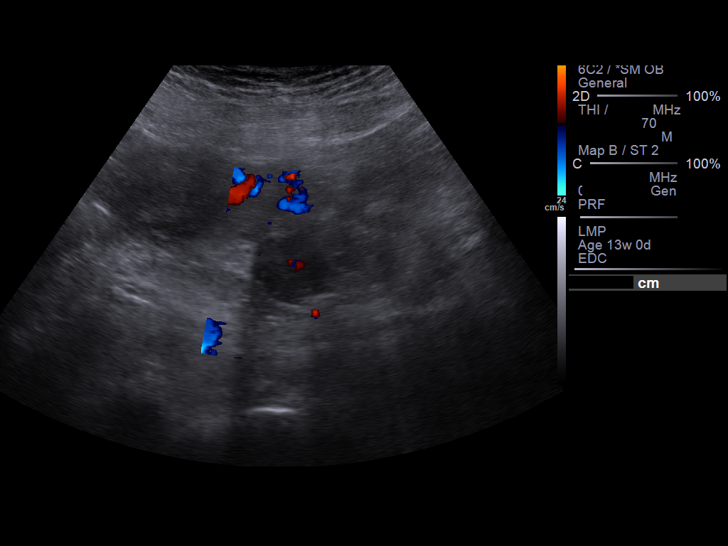
[im 29/70]
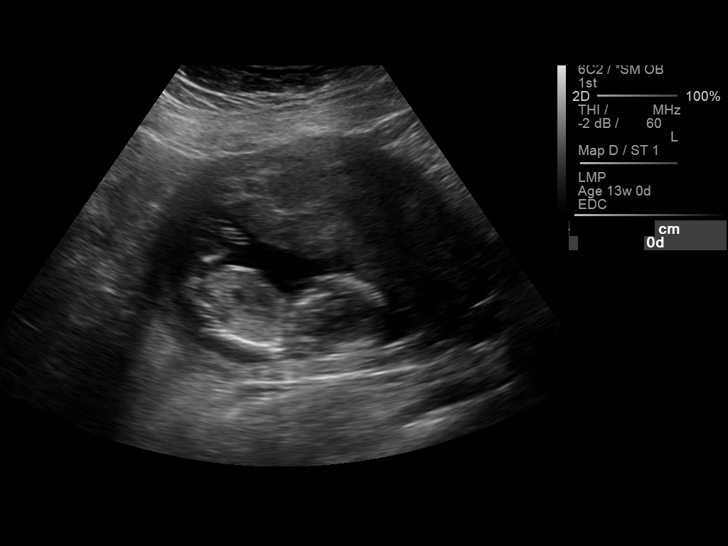
[im 34/70]
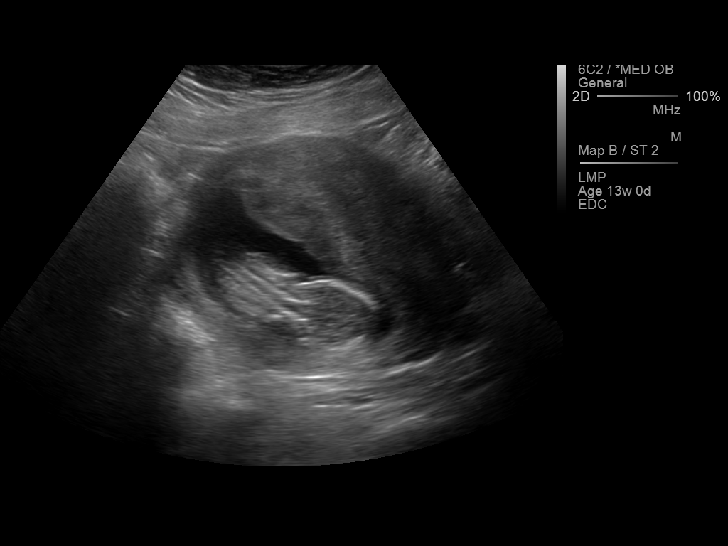
[im 39/70]
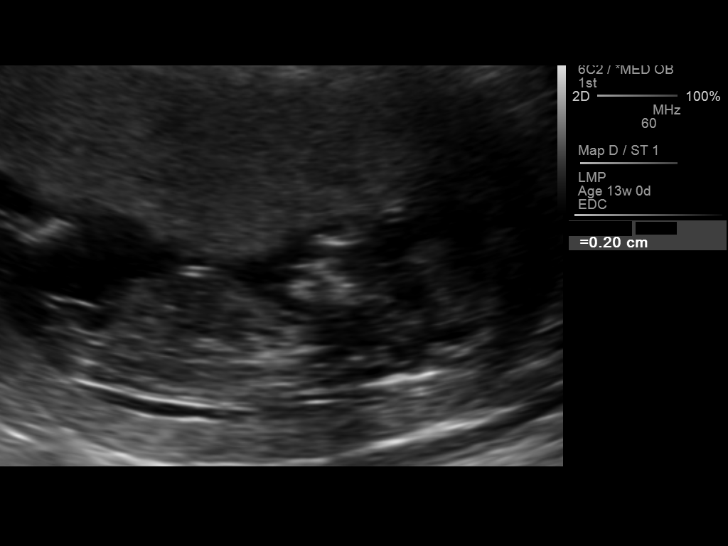
[im 44/70]
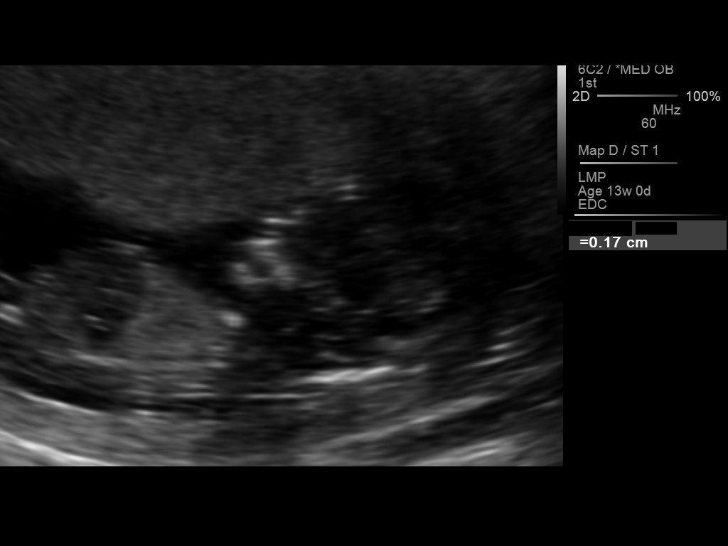
[im 49/70]
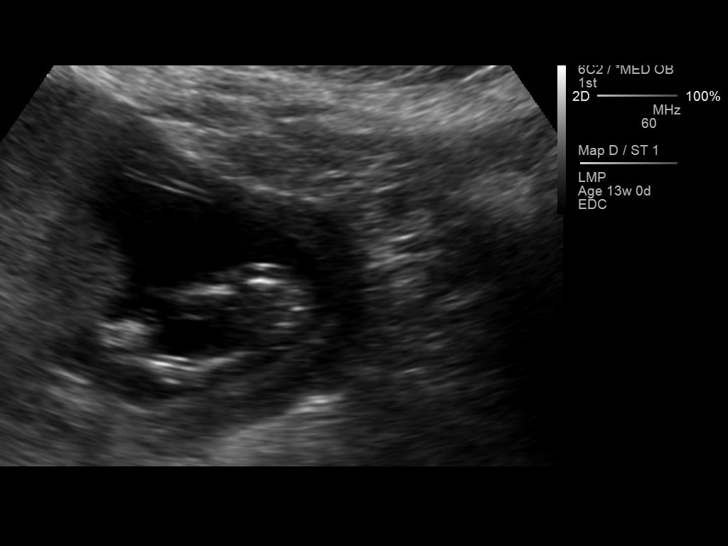
[im 54/70]
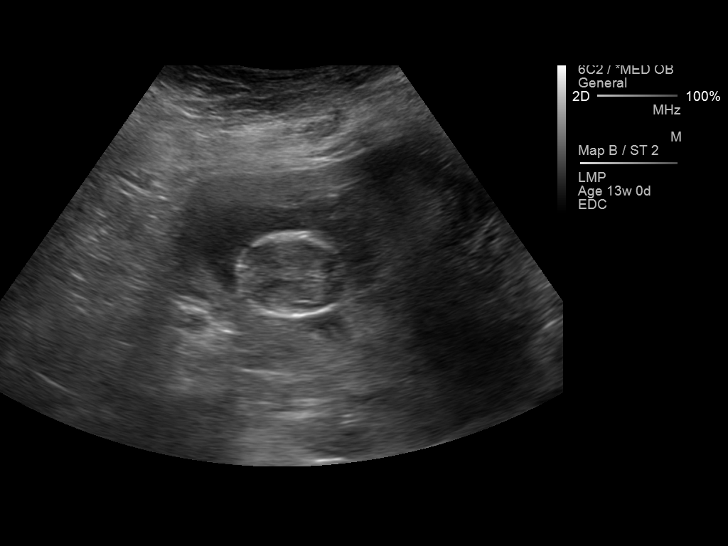
[im 59/70]
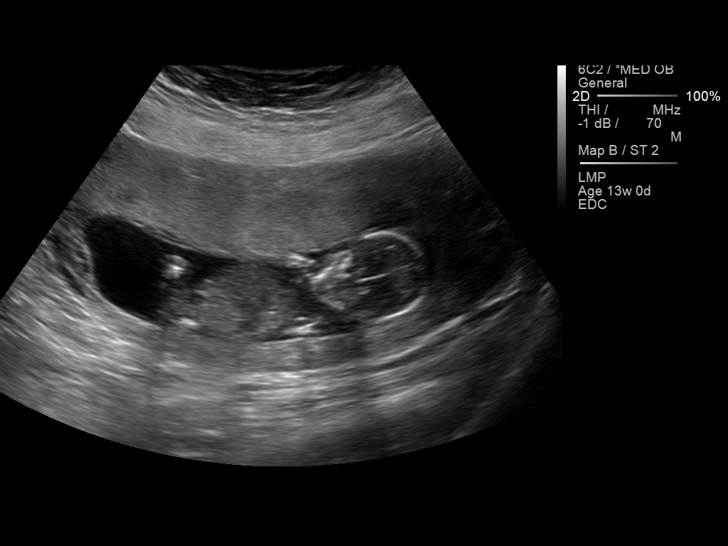
[im 64/70]
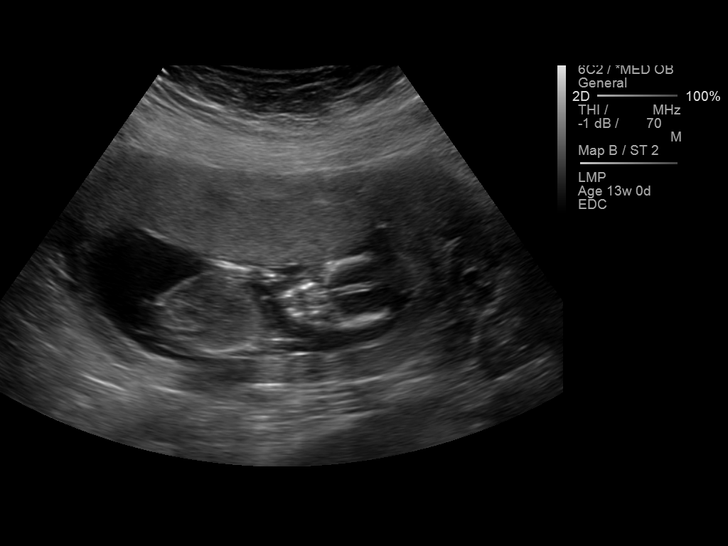
[im 70/70]
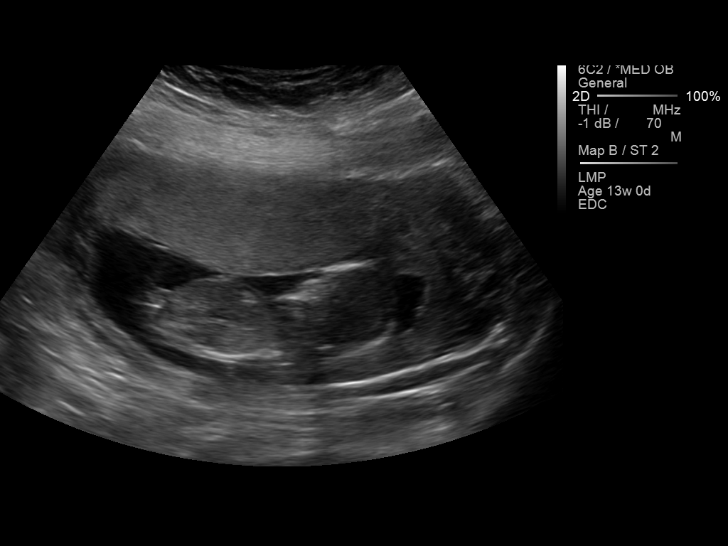

[14 of 28 positions shown; findings below may reference images not displayed]

IMAGES IMPORTED FROM THE [employer] WORKFLOW SYSTEM
NO DICTATION FOR STUDY

## 2015-12-02 ENCOUNTER — Emergency Department
Admission: EM | Admit: 2015-12-02 | Discharge: 2015-12-02 | Disposition: A | Discharge status: Home / Self Care | Payer: Medicaid Other | Attending: Emergency Medicine | Admitting: Emergency Medicine

## 2015-12-02 ENCOUNTER — Encounter: Payer: Self-pay | Admitting: *Deleted

## 2015-12-02 DIAGNOSIS — F17210 Nicotine dependence, cigarettes, uncomplicated: Secondary | ICD-10-CM | POA: Diagnosis not present

## 2015-12-02 DIAGNOSIS — G43909 Migraine, unspecified, not intractable, without status migrainosus: Secondary | ICD-10-CM | POA: Diagnosis present

## 2015-12-02 DIAGNOSIS — Z792 Long term (current) use of antibiotics: Secondary | ICD-10-CM | POA: Insufficient documentation

## 2015-12-02 DIAGNOSIS — G43809 Other migraine, not intractable, without status migrainosus: Secondary | ICD-10-CM

## 2015-12-02 MED ORDER — ONDANSETRON HCL 4 MG/2ML IJ SOLN
INTRAMUSCULAR | Status: AC
  Filled 2015-12-02: qty 2

## 2015-12-02 MED ORDER — PROCHLORPERAZINE EDISYLATE 5 MG/ML IJ SOLN
10.0000 mg | Freq: Once | INTRAMUSCULAR | Status: AC
  Administered 2015-12-02: 10 mg via INTRAVENOUS
  Filled 2015-12-02: qty 2

## 2015-12-02 MED ORDER — SODIUM CHLORIDE 0.9 % IV SOLN
Freq: Once | INTRAVENOUS | Status: AC
  Administered 2015-12-02: 1000 mL via INTRAVENOUS

## 2015-12-02 MED ORDER — DIPHENHYDRAMINE HCL 50 MG/ML IJ SOLN
25.0000 mg | Freq: Once | INTRAMUSCULAR | Status: AC
  Administered 2015-12-02: 25 mg via INTRAVENOUS
  Filled 2015-12-02: qty 1

## 2015-12-02 NOTE — ED Notes (Signed)
Pt in via triage with complaints of a migraine x 3 days; pt reports pain 10/10, nausea/vomiting x 1 episode, pt denies and vision changes.  Pt A/Ox4, vitals WDL, no immediate distress at this time.

## 2015-12-02 NOTE — ED Provider Notes (Signed)
[facility] Emergency Department Provider Note  ____________________________________________  Time seen: Approximately 7:09 PM  I have reviewed the triage vital signs and the nursing notes.   HISTORY  Chief Complaint Migraine    HPI Anne SAKSA is a 35 y.o. female who reports that her usual bad migraines been going on for 3 days with nausea. It began with bright flashing lights her usual type of scotomata which is since resolved. Patient has no other unusual symptoms. She is just one of her bad migraines. It has been going on for 3 days and is severe at the present time. It is made worse by bright lights and loud noises. She has vomited once and is nauseated.   Past Medical History  Diagnosis Date  . Heart murmur   . Bronchitis     There are no active problems to display for this patient.   Past Surgical History  Procedure Laterality Date  . Cesarean section      x4  . Tonsillectomy      Current Outpatient Rx  Name  Route  Sig  Dispense  Refill  . amoxicillin (AMOXIL) 500 MG capsule   Oral   Take 1 capsule (500 mg total) by mouth 3 (three) times daily.   30 capsule   0     Allergies Amitriptyline  History reviewed. No pertinent family history.  Social History Social History  Substance Use Topics  . Smoking status: Current Every Day Smoker -- 0.50 packs/day    Types: Cigarettes  . Smokeless tobacco: None  . Alcohol Use: No    Review of Systems Constitutional: No fever/chills Eyes: No visual changes At present. ENT: No sore throat. Cardiovascular: Denies chest pain. Respiratory: Denies shortness of breath. Gastrointestinal: No abdominal pain.  No nausea, no vomiting.  No diarrhea.  No constipation. Genitourinary: Negative for dysuria. Musculoskeletal: Negative for back pain. Skin: Negative for rash. Neurological: Negative for focal weakness or numbness.  10-point ROS otherwise  negative.  ____________________________________________   PHYSICAL EXAM:  VITAL SIGNS: ED Triage Vitals  Enc Vitals Group     BP 12/02/15 1736 125/78 mmHg     Pulse Rate 12/02/15 1736 91     Resp 12/02/15 1736 18     Temp 12/02/15 1736 98.7 F (37.1 C)     Temp Source 12/02/15 1736 Oral     SpO2 12/02/15 1736 95 %     Weight 12/02/15 1736 210 lb (95.255 kg)     Height 12/02/15 1736  (1.626 m)     Head Cir --      Peak Flow --      Pain Score 12/02/15 1736 10     Pain Loc --      Pain Edu? --      Excl. in GC? --     Constitutional: Alert and oriented.  Eyes: Conjunctivae are normal. PERRL. EOMI. Head: Atraumatic. Nose: No congestion/rhinnorhea. Mouth/Throat: Mucous membranes are moist.  Oropharynx non-erythematous. Neck: No stridor.   Cardiovascular: Normal rate, regular rhythm. Grossly normal heart sounds.  Good peripheral circulation. Respiratory: Normal respiratory effort.  No retractions. Lungs CTAB. Gastrointestinal: Soft and nontender. No distention. No abdominal bruits. No CVA tenderness. Musculoskeletal: No lower extremity tenderness nor edema.  No joint effusions. Neurologic:  Normal speech and language. No gross focal neurologic deficits are appreciated Cranial nerves II through XII are intact cerebellar finger-nose and rapid alternating movements is normal sensation is normal motor strength in the arms and legs are  normal.. No gait instability. Skin:  Skin is warm, dry and intact. No rash noted. Psychiatric: Mood and affect are normal. Speech and behavior are normal.  ____________________________________________   LABS (all labs ordered are listed, but only abnormal results are displayed)  Labs Reviewed - No data to  display ____________________________________________  EKG   ____________________________________________  RADIOLOGY   ____________________________________________   PROCEDURES    ____________________________________________   INITIAL IMPRESSION / ASSESSMENT AND PLAN / ED COURSE  Pertinent labs & imaging results that were available during my care of the patient were reviewed by me and considered in my medical decision making (see chart for details).   ____________________________________________   FINAL CLINICAL IMPRESSION(S) / ED DIAGNOSES  Final diagnoses:  Other type of migraine      Arnaldo NatalPaul F Malinda, MD 12/02/15 2213

## 2015-12-02 NOTE — ED Notes (Addendum)
States migrane for 3 days with nausea and photosenstivity, pt took tylenol at home, currently breastfeeding, hx of migranes

## 2015-12-02 NOTE — Discharge Instructions (Signed)
Migraine Headache A migraine headache is an intense, throbbing pain on one or both sides of your head. A migraine can last for 30 minutes to several hours. CAUSES  The exact cause of a migraine headache is not always known. However, a migraine may be caused when nerves in the brain become irritated and release chemicals that cause inflammation. This causes pain. Certain things may also trigger migraines, such as:  Alcohol.  Smoking.  Stress.  Menstruation.  Aged cheeses.  Foods or drinks that contain nitrates, glutamate, aspartame, or tyramine.  Lack of sleep.  Chocolate.  Caffeine.  Hunger.  Physical exertion.  Fatigue.  Medicines used to treat chest pain (nitroglycerine), birth control pills, estrogen, and some blood pressure medicines. SIGNS AND SYMPTOMS  Pain on one or both sides of your head.  Pulsating or throbbing pain.  Severe pain that prevents daily activities.  Pain that is aggravated by any physical activity.  Nausea, vomiting, or both.  Dizziness.  Pain with exposure to bright lights, loud noises, or activity.  General sensitivity to bright lights, loud noises, or smells. Before you get a migraine, you may get warning signs that a migraine is coming (aura). An aura may include:  Seeing flashing lights.  Seeing bright spots, halos, or zigzag lines.  Having tunnel vision or blurred vision.  Having feelings of numbness or tingling.  Having trouble talking.  Having muscle weakness. DIAGNOSIS  A migraine headache is often diagnosed based on:  Symptoms.  Physical exam.  A CT scan or MRI of your head. These imaging tests cannot diagnose migraines, but they can help rule out other causes of headaches. TREATMENT Medicines may be given for pain and nausea. Medicines can also be given to help prevent recurrent migraines.  HOME CARE INSTRUCTIONS  Only take over-the-counter or prescription medicines for pain or discomfort as directed by your  health care provider. The use of long-term narcotics is not recommended.  Lie down in a dark, quiet room when you have a migraine.  Keep a journal to find out what may trigger your migraine headaches. For example, write down:  What you eat and drink.  How much sleep you get.  Any change to your diet or medicines.  Limit alcohol consumption.  Quit smoking if you smoke.  Get 7-9 hours of sleep, or as recommended by your health care provider.  Limit stress.  Keep lights dim if bright lights bother you and make your migraines worse. SEEK IMMEDIATE MEDICAL CARE IF:   Your migraine becomes severe.  You have a fever.  You have a stiff neck.  You have vision loss.  You have muscular weakness or loss of muscle control.  You start losing your balance or have trouble walking.  You feel faint or pass out.  You have severe symptoms that are different from your first symptoms. MAKE SURE YOU:   Understand these instructions.  Will watch your condition.  Will get help right away if you are not doing well or get worse.   This information is not intended to replace advice given to you by your health care provider. Make sure you discuss any questions you have with your health care provider.   Document Released: 07/24/2005 Document Revised: 08/14/2014 Document Reviewed: 03/31/2013 Elsevier Interactive Patient Education [employer]2016 [employer]   Please return for any further problems. Remember not to breast feed for about 24 hours until all the medicines or other system. You can pump and throw out the note for the time  being.

## 2015-12-14 ENCOUNTER — Emergency Department: Payer: Medicaid Other

## 2015-12-14 ENCOUNTER — Emergency Department
Admission: EM | Admit: 2015-12-14 | Discharge: 2015-12-14 | Disposition: A | Discharge status: Home / Self Care | Payer: Medicaid Other | Attending: Emergency Medicine | Admitting: Emergency Medicine

## 2015-12-14 ENCOUNTER — Encounter: Payer: Self-pay | Admitting: Emergency Medicine

## 2015-12-14 DIAGNOSIS — Z792 Long term (current) use of antibiotics: Secondary | ICD-10-CM | POA: Insufficient documentation

## 2015-12-14 DIAGNOSIS — R1031 Right lower quadrant pain: Secondary | ICD-10-CM | POA: Insufficient documentation

## 2015-12-14 DIAGNOSIS — F17210 Nicotine dependence, cigarettes, uncomplicated: Secondary | ICD-10-CM | POA: Insufficient documentation

## 2015-12-14 LAB — COMPREHENSIVE METABOLIC PANEL
ALK PHOS: 81 U/L (ref 38–126)
ALT: 46 U/L (ref 14–54)
AST: 35 U/L (ref 15–41)
Albumin: 4.3 g/dL (ref 3.5–5.0)
Anion gap: 9 (ref 5–15)
BILIRUBIN TOTAL: 0.3 mg/dL (ref 0.3–1.2)
BUN: 6 mg/dL (ref 6–20)
CALCIUM: 9.6 mg/dL (ref 8.9–10.3)
CO2: 28 mmol/L (ref 22–32)
CREATININE: 0.7 mg/dL (ref 0.44–1.00)
Chloride: 102 mmol/L (ref 101–111)
GFR calc Af Amer: >60 mL/min (ref >60)
GLUCOSE: 118 mg/dL — AB (ref 65–99)
Potassium: 4 mmol/L (ref 3.5–5.1)
Sodium: 139 mmol/L (ref 135–145)
TOTAL PROTEIN: 7.6 g/dL (ref 6.5–8.1)

## 2015-12-14 LAB — CBC
HCT: 43.6 % (ref 35.0–47.0)
Hemoglobin: 14.8 g/dL (ref 12.0–16.0)
MCH: 31.5 pg (ref 26.0–34.0)
MCHC: 33.9 g/dL (ref 32.0–36.0)
MCV: 92.8 fL (ref 80.0–100.0)
PLATELETS: 318 10*3/uL (ref 150–440)
RBC: 4.7 MIL/uL (ref 3.80–5.20)
RDW: 13.2 % (ref 11.5–14.5)
WBC: 15.3 10*3/uL — AB (ref 3.6–11.0)

## 2015-12-14 LAB — URINALYSIS COMPLETE WITH MICROSCOPIC (ARMC ONLY)
BILIRUBIN URINE: NEGATIVE
Bacteria, UA: NONE SEEN
GLUCOSE, UA: NEGATIVE mg/dL
Ketones, ur: NEGATIVE mg/dL
LEUKOCYTES UA: NEGATIVE
NITRITE: NEGATIVE
Protein, ur: NEGATIVE mg/dL
SPECIFIC GRAVITY, URINE: 1.008 (ref 1.005–1.030)
pH: 6 (ref 5.0–8.0)

## 2015-12-14 LAB — POCT PREGNANCY, URINE: PREG TEST UR: NEGATIVE

## 2015-12-14 LAB — LIPASE, BLOOD: Lipase: 29 U/L (ref 11–51)

## 2015-12-14 MED ORDER — HYDROCODONE-ACETAMINOPHEN 5-325 MG PO TABS: 1.0000 | ORAL_TABLET | ORAL | Status: AC | PRN

## 2015-12-14 MED ORDER — MORPHINE SULFATE (PF) 4 MG/ML IV SOLN
4.0000 mg | Freq: Once | INTRAVENOUS | Status: AC
  Administered 2015-12-14: 4 mg via INTRAVENOUS

## 2015-12-14 MED ORDER — DIATRIZOATE MEGLUMINE & SODIUM 66-10 % PO SOLN
15.0000 mL | Freq: Once | ORAL | Status: AC
  Administered 2015-12-14: 15 mL via ORAL

## 2015-12-14 MED ORDER — ONDANSETRON HCL 4 MG/2ML IJ SOLN
4.0000 mg | Freq: Once | INTRAMUSCULAR | Status: AC
Start: 2015-12-14 — End: 2015-12-14
  Administered 2015-12-14: 4 mg via INTRAVENOUS

## 2015-12-14 MED ORDER — IOPAMIDOL (ISOVUE-300) INJECTION 61%
100.0000 mL | Freq: Once | INTRAVENOUS | Status: AC | PRN
  Administered 2015-12-14: 100 mL via INTRAVENOUS
  Filled 2015-12-14: qty 100

## 2015-12-14 MED ORDER — ONDANSETRON HCL 4 MG/2ML IJ SOLN
INTRAMUSCULAR | Status: AC
  Administered 2015-12-14: 4 mg via INTRAVENOUS
  Filled 2015-12-14: qty 2

## 2015-12-14 MED ORDER — MORPHINE SULFATE (PF) 4 MG/ML IV SOLN
INTRAVENOUS | Status: AC
  Administered 2015-12-14: 4 mg via INTRAVENOUS
  Filled 2015-12-14: qty 1

## 2015-12-14 NOTE — ED Notes (Signed)
Pt presents to ED with c/o right lower quadrant abdominal pain, onset around 1800 this evening. Pt denies N/V/D or urinary symptoms. Pt alerts and oriented x4 at this time, airway intact.

## 2015-12-14 NOTE — Discharge Instructions (Signed)

## 2015-12-14 NOTE — ED Notes (Signed)
Pt reports she is having right side pain that radiates into her lower right abd - pt denies nausea and vomiting - pt denies diarrhea and had last normal BM this am - Pt denies burning/pain/urgency with urination

## 2015-12-14 NOTE — ED Provider Notes (Signed)
[facility] Emergency Department Provider Note  ____________________________________________    I have reviewed the triage vital signs and the nursing notes.   HISTORY  Chief Complaint Abdominal Pain    HPI Anne Rose is a 35 y.o. female who presents with complaints of right lower quadrant abdominal pain, she reports the pain started proximal 6 PM this evening. She reports the pain is cramping in nature and moderate to severe. She denies nausea vomiting or diarrhea. She denies vaginal discharge. She denies dysuria. No fevers or chills. She reports she has never had this pain before.     Past Medical History  Diagnosis Date  . Heart murmur   . Bronchitis     There are no active problems to display for this patient.   Past Surgical History  Procedure Laterality Date  . Cesarean section      x4  . Tonsillectomy      Current Outpatient Rx  Name  Route  Sig  Dispense  Refill  . amoxicillin (AMOXIL) 500 MG capsule   Oral   Take 1 capsule (500 mg total) by mouth 3 (three) times daily.   30 capsule   0     Allergies Amitriptyline  History reviewed. No pertinent family history.  Social History Social History  Substance Use Topics  . Smoking status: Current Every Day Smoker -- 0.50 packs/day    Types: Cigarettes  . Smokeless tobacco: None  . Alcohol Use: No    Review of Systems  Constitutional: Negative for fever. Eyes: Negative for redness ENT: Negative for sore throat Cardiovascular: Negative for chest pain Respiratory: Negative for shortness of breath. Gastrointestinal: As above Genitourinary: Negative for dysuria. Musculoskeletal: Negative for back pain. Skin: Negative for rash. Neurological: Negative for headache Psychiatric: no anxiety    ____________________________________________   PHYSICAL EXAM:  VITAL SIGNS: ED Triage Vitals  Enc Vitals Group     BP 12/14/15 1858 130/91 mmHg     Pulse Rate 12/14/15 1858  95     Resp 12/14/15 1858 18     Temp 12/14/15 1858 98.1 F (36.7 C)     Temp Source 12/14/15 1858 Oral     SpO2 12/14/15 1858 99 %     Weight 12/14/15 1858 208 lb (94.348 kg)     Height 12/14/15 1858  (1.626 m)     Head Cir --      Peak Flow --      Pain Score 12/14/15 1906 6     Pain Loc --      Pain Edu? --      Excl. in GC? --      Constitutional: Alert and oriented. Well appearing and in no distress.  Eyes: Conjunctivae are normal. No erythema or injection ENT   Head: Normocephalic and atraumatic.   Mouth/Throat: Mucous membranes are moist. Cardiovascular: Normal rate, regular rhythm. Normal and symmetric distal pulses are present in the upper extremities.  Respiratory: Normal respiratory effort without tachypnea nor retractions.  Gastrointestinal: Mild right lower quadrant tenderness to palpation. No distention. There is no CVA tenderness. Genitourinary: deferred Musculoskeletal: Nontender with normal range of motion in all extremities.  Neurologic:  Normal speech and language. No gross focal neurologic deficits are appreciated. Skin:  Skin is warm, dry and intact. No rash noted. Psychiatric: Mood and affect are normal. Patient exhibits appropriate insight and judgment.  ____________________________________________    LABS (pertinent positives/negatives)  Labs Reviewed  COMPREHENSIVE METABOLIC PANEL - Abnormal; Notable for the  following:    Glucose, Bld 118 (*)    All other components within normal limits  CBC - Abnormal; Notable for the following:    WBC 15.3 (*)    All other components within normal limits  URINALYSIS COMPLETEWITH MICROSCOPIC ([facility] ONLY) - Abnormal; Notable for the following:    Color, Urine YELLOW (*)    APPearance CLEAR (*)    Hgb urine dipstick 1+ (*)    Squamous Epithelial / LPF 0-5 (*)    All other components within normal limits  LIPASE, BLOOD  POC URINE PREG, ED  POCT PREGNANCY, URINE     ____________________________________________   EKG  None  ____________________________________________    RADIOLOGY  CT scan shows normal appendix, 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  Patient presents with right lower quadrant abdominal pain. No vaginal discharge or dysuria. CT scan shows normal appendix.  Patient is feeling improved but is not pain-free. We discussed other options including obtaining pelvic ultrasound the patient is not willing to stay in the emergency department to do that at this time. She would prefer to go home with pain medications and agrees to return if no relief of her symptoms in the next 24 hours.   ____________________________________________   FINAL CLINICAL IMPRESSION(S) / ED DIAGNOSES  Final diagnoses:  Right lower quadrant abdominal pain          Jene Everyobert Ima Hafner, MD 12/14/15 2242
